# Patient Record
Sex: Female | Born: 1985 | Race: Black or African American | Hispanic: No | Marital: Single | Smoking: Light tobacco smoker
Health system: Southern US, Community
[De-identification: ages and names within clinical notes are randomized; demographics above are authoritative.]

---
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
## 2016-05-03 ENCOUNTER — Emergency Department (HOSPITAL_COMMUNITY)
Admission: EM | Admit: 2016-05-03 | Discharge: 2016-05-03 | Disposition: A | Discharge status: Home / Self Care | Payer: Self-pay | Attending: Emergency Medicine | Admitting: Emergency Medicine

## 2016-05-03 ENCOUNTER — Encounter (HOSPITAL_COMMUNITY): Payer: Self-pay

## 2016-05-03 DIAGNOSIS — M79605 Pain in left leg: Secondary | ICD-10-CM | POA: Insufficient documentation

## 2016-05-03 MED ORDER — METHOCARBAMOL 500 MG PO TABS: 500.0000 mg | ORAL_TABLET | Freq: Two times a day (BID) | ORAL | 0 refills | Status: DC

## 2016-05-03 MED ORDER — NAPROXEN 500 MG PO TABS: 500.0000 mg | ORAL_TABLET | Freq: Two times a day (BID) | ORAL | 0 refills | Status: DC

## 2016-05-03 NOTE — ED Provider Notes (Signed)
[facility]-EMERGENCY DEPT Provider Note   CSN: [id number] Arrival date & time: 05/03/16  1029     History   Chief Complaint Chief Complaint  Patient presents with  . Numbness  . Leg Pain    HPI Donna West is a 31 y.o. female.  HPI   Donna West is a 31 y.o. female, patient with no pertinent past medical history, presenting to the ED with left lower leg pain beginning last night. Pain starts at the left knee and, with standing, radiates down towards the heel. Pain improves when not weightbearing. Patient also endorses some associated tingling in the heel with weightbearing. Patient states that she recently began a new job that involves standing for at least 8 hours at a time. Patient has not taken any medications for her symptoms. Denies weakness, known trauma, fever, or any other complaints.   History reviewed. No pertinent past medical history.  There are no active problems to display for this patient.   History reviewed. No pertinent surgical history.  OB History    No data available       Home Medications    Prior to Admission medications   Medication Sig Start Date End Date Taking? Authorizing Provider  methocarbamol (ROBAXIN) 500 MG tablet Take 1 tablet (500 mg total) by mouth 2 (two) times daily. 05/03/16   Shawn C Joy, PA-C  naproxen (NAPROSYN) 500 MG tablet Take 1 tablet (500 mg total) by mouth 2 (two) times daily. 05/03/16   Anselm Pancoast, PA-C    Family History History reviewed. No pertinent family history.  Social History Social History  Substance Use Topics  . Smoking status: Never Smoker  . Smokeless tobacco: Never Used  . Alcohol use Yes     Comment: occasionally     Allergies   Patient has no known allergies.   Review of Systems Review of Systems  Musculoskeletal: Positive for arthralgias and myalgias. Negative for back pain and joint swelling.  Neurological: Negative for weakness and numbness.     Physical Exam Updated Vital Signs BP  136/85   Pulse (!) 48   Temp 98.5 F (36.9 C) (Oral)   Resp 20   Ht 5\' 3"  (1.6 m)   Wt 54.4 kg   LMP 04/27/2016   SpO2 97%   BMI 21.26 kg/m   Physical Exam  Constitutional: She appears well-developed and well-nourished. No distress.  HENT:  Head: Normocephalic and atraumatic.  Eyes: Conjunctivae are normal.  Neck: Neck supple.  Cardiovascular: Normal rate, regular rhythm and intact distal pulses.   Pulmonary/Chest: Effort normal.  Musculoskeletal: Normal range of motion. She exhibits tenderness. She exhibits no edema or deformity.  Mild tenderness to palpation posterior left knee. Full range of motion in the left knee. No swelling, deformity, crepitus, effusion, or laxity noted. Patient is weightbearing without difficulty or assistance.  Neurological: She is alert.  No sensory deficits. Strength 5 out of 5 with flexion and extension of the lower extremities. No gait deficit. Coordination intact.  Skin: Skin is warm and dry. Capillary refill takes less than 2 seconds. She is not diaphoretic.  Psychiatric: She has a normal mood and affect. Her behavior is normal.  Nursing note and vitals reviewed.    ED Treatments / Results  Labs (all labs ordered are listed, but only abnormal results are displayed) Labs Reviewed - No data to display  EKG  EKG Interpretation None       Radiology No results found.  Procedures Procedures (including critical care  time)  Medications Ordered in ED Medications - No data to display   Initial Impression / Assessment and Plan / ED Course  I have reviewed the triage vital signs and the nursing notes.  Pertinent labs & imaging results that were available during my care of the patient were reviewed by me and considered in my medical decision making (see chart for details).  Clinical Course     Patient presents with left leg pain beginning yesterday. Suspect the pain is connected with inflammation from overuse, secondary to her new job.  Home care and return precautions discussed.    Final Clinical Impressions(s) / ED Diagnoses   Final diagnoses:  Left leg pain    New Prescriptions Discharge Medication List as of 05/03/2016  7:22 PM    START taking these medications   Details  methocarbamol (ROBAXIN) 500 MG tablet Take 1 tablet (500 mg total) by mouth 2 (two) times daily., Starting Thu 05/03/2016, Print    naproxen (NAPROSYN) 500 MG tablet Take 1 tablet (500 mg total) by mouth 2 (two) times daily., Starting Thu 05/03/2016, Print         Anselm PancoastShawn C Joy, PA-C 05/04/16 0055    Anselm PancoastShawn C Joy, PA-C 05/04/16 0056    Lyndal Pulleyaniel Knott, MD 05/04/16 [phone]

## 2016-05-03 NOTE — Discharge Instructions (Signed)
Expect your soreness to increase over the next 2-3 days. Take it easy, but do not lay around too much as this may make any stiffness worse. Take 500 mg of naproxen every 12 hours or 800 mg of ibuprofen every 8 hours for the next 3 days. Take these medications with food to avoid upset stomach. Robaxin is a muscle relaxer and may help loosen stiff muscles. Do not take the Robaxin while driving or performing other dangerous activities. Be sure to perform the attached exercises starting with three times a week and working up to performing them daily. This is an essential part of preventing long term problems. Follow up with a primary care provider for any future management of these complaints. °

## 2016-05-03 NOTE — ED Triage Notes (Signed)
Patient states she has tingling of the left upper leg when resting and when she stands she has pain and numbness to the left foot. Pain and numbness started yesterday

## 2017-06-12 ENCOUNTER — Emergency Department (HOSPITAL_COMMUNITY)
Admission: EM | Admit: 2017-06-12 | Discharge: 2017-06-12 | Disposition: A | Discharge status: Home / Self Care | Payer: Self-pay | Attending: Emergency Medicine | Admitting: Emergency Medicine

## 2017-06-12 ENCOUNTER — Other Ambulatory Visit: Payer: Self-pay

## 2017-06-12 ENCOUNTER — Encounter (HOSPITAL_COMMUNITY): Payer: Self-pay | Admitting: Obstetrics and Gynecology

## 2017-06-12 ENCOUNTER — Emergency Department (HOSPITAL_COMMUNITY): Payer: Self-pay

## 2017-06-12 DIAGNOSIS — Y929 Unspecified place or not applicable: Secondary | ICD-10-CM | POA: Insufficient documentation

## 2017-06-12 DIAGNOSIS — S0280XA Fracture of other specified skull and facial bones, unspecified side, initial encounter for closed fracture: Secondary | ICD-10-CM | POA: Insufficient documentation

## 2017-06-12 DIAGNOSIS — Y999 Unspecified external cause status: Secondary | ICD-10-CM | POA: Insufficient documentation

## 2017-06-12 DIAGNOSIS — S0285XA Fracture of orbit, unspecified, initial encounter for closed fracture: Secondary | ICD-10-CM

## 2017-06-12 DIAGNOSIS — M545 Low back pain, unspecified: Secondary | ICD-10-CM

## 2017-06-12 DIAGNOSIS — S6991XA Unspecified injury of right wrist, hand and finger(s), initial encounter: Secondary | ICD-10-CM | POA: Insufficient documentation

## 2017-06-12 DIAGNOSIS — S022XXA Fracture of nasal bones, initial encounter for closed fracture: Secondary | ICD-10-CM | POA: Insufficient documentation

## 2017-06-12 DIAGNOSIS — Y939 Activity, unspecified: Secondary | ICD-10-CM | POA: Insufficient documentation

## 2017-06-12 LAB — POC URINE PREG, ED: Preg Test, Ur: NEGATIVE

## 2017-06-12 MED ORDER — OXYCODONE-ACETAMINOPHEN 5-325 MG PO TABS
1.0000 | ORAL_TABLET | Freq: Once | ORAL | Status: AC
  Administered 2017-06-12: 1 via ORAL
  Filled 2017-06-12: qty 1

## 2017-06-12 MED ORDER — HYDROCODONE-ACETAMINOPHEN 5-325 MG PO TABS: 1.0000 | ORAL_TABLET | Freq: Four times a day (QID) | ORAL | 0 refills | Status: DC | PRN
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
## 2017-06-12 NOTE — Discharge Instructions (Signed)
Please read instructions below. You can take 600 mg of Advil/ibuprofen every 6 hours as needed for pain. You can take hydrocodone every 6 hours as needed for moderate-severe pain. Do not take tylenol, drink alcohol, or drive while taking this medication. You can take advil/ibuprofen with this medication. Apply ice to your face and wrist for 20 minutes at a time. Do NOT blow your nose. Schedule an appointment with the ENT specialist, Dr. Jearld FentonByers, on Monday for re-evaluation. Return to the ER for severely worsening headache, vision changes, if you are unable to move your eyes in all directions, bowel/bladder incontinence, vomiting, weakness or numbness in your extremities, or new or concerning symptoms.
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
## 2017-06-12 NOTE — ED Notes (Signed)
I have called Social work. Social work will be in route to room to assist pt in finding resources such as housing, help and living accomodations for current situation.
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
## 2019-03-04 ENCOUNTER — Emergency Department (HOSPITAL_COMMUNITY)
Admission: EM | Admit: 2019-03-04 | Discharge: 2019-03-05 | Disposition: A | Discharge status: Home / Self Care | Payer: Self-pay | Attending: Emergency Medicine | Admitting: Emergency Medicine

## 2019-03-04 ENCOUNTER — Encounter (HOSPITAL_COMMUNITY): Payer: Self-pay

## 2019-03-04 ENCOUNTER — Other Ambulatory Visit: Payer: Self-pay

## 2019-03-04 DIAGNOSIS — R112 Nausea with vomiting, unspecified: Secondary | ICD-10-CM | POA: Insufficient documentation

## 2019-03-04 DIAGNOSIS — R197 Diarrhea, unspecified: Secondary | ICD-10-CM | POA: Insufficient documentation

## 2019-03-04 DIAGNOSIS — F17210 Nicotine dependence, cigarettes, uncomplicated: Secondary | ICD-10-CM | POA: Insufficient documentation

## 2019-03-04 LAB — LIPASE, BLOOD: Lipase: 33 U/L (ref 11–51)

## 2019-03-04 LAB — CBC
HCT: 39.6 % (ref 36.0–46.0)
Hemoglobin: 12.6 g/dL (ref 12.0–15.0)
MCH: 31.5 pg (ref 26.0–34.0)
MCHC: 31.8 g/dL (ref 30.0–36.0)
MCV: 99 fL (ref 80.0–100.0)
Platelets: 141 K/uL — ABNORMAL LOW (ref 150–400)
RBC: 4 MIL/uL (ref 3.87–5.11)
RDW: 11.4 % — ABNORMAL LOW (ref 11.5–15.5)
WBC: 9.5 K/uL (ref 4.0–10.5)
nRBC: 0 % (ref 0.0–0.2)

## 2019-03-04 LAB — URINALYSIS, ROUTINE W REFLEX MICROSCOPIC
Bilirubin Urine: NEGATIVE
Glucose, UA: NEGATIVE mg/dL
Hgb urine dipstick: NEGATIVE
Ketones, ur: 5 mg/dL — AB
Leukocytes,Ua: NEGATIVE
Nitrite: NEGATIVE
Protein, ur: NEGATIVE mg/dL
Specific Gravity, Urine: 1.019 (ref 1.005–1.030)
pH: 7 (ref 5.0–8.0)

## 2019-03-04 LAB — COMPREHENSIVE METABOLIC PANEL
ALT: 17 U/L (ref 0–44)
AST: 28 U/L (ref 15–41)
Albumin: 4.5 g/dL (ref 3.5–5.0)
Alkaline Phosphatase: 44 U/L (ref 38–126)
Anion gap: 11 (ref 5–15)
BUN: 13 mg/dL (ref 6–20)
CO2: 24 mmol/L (ref 22–32)
Calcium: 9.3 mg/dL (ref 8.9–10.3)
Chloride: 105 mmol/L (ref 98–111)
Creatinine, Ser: 0.74 mg/dL (ref 0.44–1.00)
GFR calc Af Amer: >60 mL/min (ref >60)
GFR calc non Af Amer: >60 mL/min (ref >60)
Glucose, Bld: 96 mg/dL (ref 70–99)
Potassium: 3.8 mmol/L (ref 3.5–5.1)
Sodium: 140 mmol/L (ref 135–145)
Total Bilirubin: 0.6 mg/dL (ref 0.3–1.2)
Total Protein: 7.6 g/dL (ref 6.5–8.1)

## 2019-03-04 LAB — I-STAT BETA HCG BLOOD, ED (MC, WL, AP ONLY): I-stat hCG, quantitative: <5 m[IU]/mL (ref <5)

## 2019-03-04 MED ORDER — ONDANSETRON 4 MG PO TBDP
4.0000 mg | ORAL_TABLET | Freq: Once | ORAL | Status: AC | PRN
  Administered 2019-03-04: 20:00:00 4 mg via ORAL
  Filled 2019-03-04: qty 1

## 2019-03-04 MED ORDER — SODIUM CHLORIDE 0.9% FLUSH: 3.0000 mL | Freq: Once | INTRAVENOUS | Status: DC

## 2019-03-04 NOTE — ED Triage Notes (Signed)
Pt reports abd pain and emesis after eating salmon today around 230pm. Pt vomiting in triage.

## 2019-03-05 MED ORDER — LACTATED RINGERS IV BOLUS
1000.0000 mL | Freq: Once | INTRAVENOUS | Status: AC
  Administered 2019-03-05: 02:00:00 1000 mL via INTRAVENOUS

## 2019-03-05 MED ORDER — METOCLOPRAMIDE HCL 10 MG PO TABS: 10.0000 mg | ORAL_TABLET | Freq: Four times a day (QID) | ORAL | 0 refills | Status: AC | PRN

## 2019-03-05 MED ORDER — METOCLOPRAMIDE HCL 5 MG/ML IJ SOLN
10.0000 mg | Freq: Once | INTRAMUSCULAR | Status: AC
  Administered 2019-03-05: 02:00:00 10 mg via INTRAVENOUS
  Filled 2019-03-05: qty 2

## 2019-03-05 NOTE — ED Notes (Signed)
Meal and Beverage Given

## 2019-03-05 NOTE — ED Notes (Signed)
Patient verbalizes understanding of discharge instructions. Opportunity for questioning and answers were provided. Armband removed by staff, pt discharged from ED. Pt. ambulatory and discharged home.  

## 2019-03-05 NOTE — ED Provider Notes (Signed)
Emergency Department Provider Note   I have reviewed the triage vital signs and the nursing notes.   HISTORY  Chief Complaint Emesis and Abdominal Pain   HPI Donna West is a 33 y.o. female without significant past medical history who presents to the emergency department today with vomiting.  Patient states she was at [employer] and she has some salmon.  She says shortly after this bout 20 to 30 minutes she started to have an nonbloody nonbilious emesis.  States the food was not even digested.  She multiple episodes of vomiting since then with the stomach acid.  She states she got a tablet here which seemed to help some but she still has some crampy abdominal pain.  No urinary symptoms.  Had 2 or 3 episodes of diarrhea throughout the day today as well.  No fevers.  No specific abdominal pain aside from prior to throwing up.   No other associated or modifying symptoms.    History reviewed. No pertinent past medical history.  There are no active problems to display for this patient.   History reviewed. No pertinent surgical history.  Current Outpatient Rx  . Order #: [id number] Class: Print    Allergies Patient has no known allergies.  No family history on file.  Social History Social History   Tobacco Use  . Smoking status: Light Tobacco Smoker  . Smokeless tobacco: Never Used  . Tobacco comment: Social smoker, 1 per week  Substance Use Topics  . Alcohol use: Yes    Comment: occasionally  . Drug use: No    Review of Systems  All other systems negative except as documented in the HPI. All pertinent positives and negatives as reviewed in the HPI. ____________________________________________   PHYSICAL EXAM:  VITAL SIGNS: ED Triage Vitals  Enc Vitals Group     BP 03/04/19 1839 (!) 131/92     Pulse Rate 03/04/19 1839 (!) 101     Resp 03/04/19 1839 16     Temp 03/04/19 1839 98 F (36.7 C)     Temp Source 03/04/19 1839 Oral     SpO2 03/04/19 1839 98 %     Constitutional: Alert and oriented. Well appearing and in no acute distress. Eyes: Conjunctivae are normal. PERRL. EOMI. Head: Atraumatic. Nose: No congestion/rhinnorhea. Mouth/Throat: Mucous membranes are moist.  Oropharynx non-erythematous. Neck: No stridor.  No meningeal signs.   Cardiovascular: Normal rate, regular rhythm. Good peripheral circulation. Grossly normal heart sounds.   Respiratory: Normal respiratory effort.  No retractions. Lungs CTAB. Gastrointestinal: Soft and nontender. No distention.  Musculoskeletal: No lower extremity tenderness nor edema. No gross deformities of extremities. Neurologic:  Normal speech and language. No gross focal neurologic deficits are appreciated.  Skin:  Skin is warm, dry and intact. No rash noted.  ____________________________________________   LABS (all labs ordered are listed, but only abnormal results are displayed)  Labs Reviewed  CBC - Abnormal; Notable for the following components:      Result Value   RDW 11.4 (*)    Platelets 141 (*)    All other components within normal limits  URINALYSIS, ROUTINE W REFLEX MICROSCOPIC - Abnormal; Notable for the following components:   Ketones, ur 5 (*)    All other components within normal limits  LIPASE, BLOOD  COMPREHENSIVE METABOLIC PANEL  I-STAT BETA HCG BLOOD, ED ([facility], [facility], [facility] ONLY)   ____________________________________________   INITIAL IMPRESSION / ASSESSMENT AND PLAN / ED COURSE  Abdomen benign.  Mouth dry.  Vital signs within  normal lungs.  No indication for imaging as I think it is unlikely to be specific abdominal pathology such as appendicitis, bowel obstruction, colitis, cholecystitis at this time secondary to a nonfocal exam.  White count is normal.  Low suspicion for pancreatitis without abnormal lipase or focal findings.  Will provide supportive care and reevaluate for likely discharge.  Pertinent labs & imaging results that were available during my care of the patient were  reviewed by me and considered in my medical decision making (see chart for details).  A medical screening exam was performed and I feel the patient has had an appropriate workup for their chief complaint at this time and likelihood of emergent condition existing is low. They have been counseled on decision, discharge, follow up and which symptoms necessitate immediate return to the emergency department. They or their family verbally stated understanding and agreement with plan and discharged in stable condition.   ____________________________________________  FINAL CLINICAL IMPRESSION(S) / ED DIAGNOSES  Final diagnoses:  Non-intractable vomiting with nausea, unspecified vomiting type     MEDICATIONS GIVEN DURING THIS VISIT:  Medications  ondansetron (ZOFRAN-ODT) disintegrating tablet 4 mg (4 mg Oral Given 03/04/19 2003)  lactated ringers bolus 1,000 mL (0 mLs Intravenous Stopped 03/05/19 0316)  metoCLOPramide (REGLAN) injection 10 mg (10 mg Intravenous Given 03/05/19 0212)     NEW OUTPATIENT MEDICATIONS STARTED DURING THIS VISIT:  Discharge Medication List as of 03/05/2019  6:18 AM    START taking these medications   Details  metoCLOPramide (REGLAN) 10 MG tablet Take 1 tablet (10 mg total) by mouth every 6 (six) hours as needed for nausea (nausea/headache)., Starting Thu 03/05/2019, Print        Note:  This note was prepared with assistance of Dragon voice recognition software. Occasional wrong-word or sound-a-like substitutions may have occurred due to the inherent limitations of voice recognition software.   Merrily Pew, MD 03/06/19 [phone]

## 2019-06-01 ENCOUNTER — Other Ambulatory Visit: Payer: Self-pay

## 2019-06-01 ENCOUNTER — Encounter (HOSPITAL_COMMUNITY): Payer: Self-pay | Admitting: Emergency Medicine

## 2019-06-01 ENCOUNTER — Emergency Department (HOSPITAL_COMMUNITY)
Admission: EM | Admit: 2019-06-01 | Discharge: 2019-06-01 | Disposition: A | Discharge status: Home / Self Care | Payer: Self-pay | Attending: Emergency Medicine | Admitting: Emergency Medicine

## 2019-06-01 DIAGNOSIS — R21 Rash and other nonspecific skin eruption: Secondary | ICD-10-CM

## 2019-06-01 DIAGNOSIS — L299 Pruritus, unspecified: Secondary | ICD-10-CM | POA: Insufficient documentation

## 2019-06-01 DIAGNOSIS — Z720 Tobacco use: Secondary | ICD-10-CM | POA: Insufficient documentation

## 2019-06-01 MED ORDER — HYDROXYZINE HCL 25 MG PO TABS: 25.0000 mg | ORAL_TABLET | Freq: Four times a day (QID) | ORAL | 0 refills | Status: AC

## 2019-06-01 MED ORDER — PREDNISONE 20 MG PO TABS
60.0000 mg | ORAL_TABLET | Freq: Once | ORAL | Status: AC
  Administered 2019-06-01: 17:00:00 60 mg via ORAL
  Filled 2019-06-01: qty 3

## 2019-06-01 MED ORDER — PREDNISONE 20 MG PO TABS: 60.0000 mg | ORAL_TABLET | Freq: Every day | ORAL | 0 refills | Status: AC

## 2019-06-01 MED ORDER — HYDROXYZINE HCL 25 MG PO TABS
25.0000 mg | ORAL_TABLET | Freq: Once | ORAL | Status: AC
Start: 2019-06-01 — End: 2019-06-01
  Administered 2019-06-01: 17:00:00 25 mg via ORAL
  Filled 2019-06-01: qty 1

## 2019-06-01 NOTE — ED Triage Notes (Signed)
Pt noticed a rash all over her abd, back, face and arms for a week.

## 2019-06-01 NOTE — ED Provider Notes (Signed)
[facility] EMERGENCY DEPARTMENT Provider Note   CSN: [id number] Arrival date & time: 06/01/19  1344     History Chief Complaint  Patient presents with  . Rash    Donna West is a 34 y.o. female.  Donna West is a 33 y.o. female who is otherwise healthy, presents to the ED for evaluation of a diffuse rash present over her chest, abdomen, back, arms and face.  Rash started 1 week ago as a spot on her right arm and then has spread more diffusely.  She reports the rash is extremely itchy and sometimes feels like it is burning but is not painful.  She denies any open vesicles or pustules, no drainage from the rash.  She denies any associated facial swelling, difficulty swallowing or breathing, chest pain, shortness of breath, nausea, vomiting or fevers.  Denies any history of similar rash previously.  No changes to medications, soaps, make-up, household products or diet.  She has been taking Benadryl which allows her to go to sleep at least, helps minimally with itching.        History reviewed. No pertinent past medical history.  There are no problems to display for this patient.   History reviewed. No pertinent surgical history.   OB History    Gravida      Para      Term      Preterm      AB      Living  0     SAB      TAB      Ectopic      Multiple      Live Births              No family history on file.  Social History   Tobacco Use  . Smoking status: Light Tobacco Smoker  . Smokeless tobacco: Never Used  . Tobacco comment: Social smoker, 1 per week  Substance Use Topics  . Alcohol use: Yes    Comment: occasionally  . Drug use: No    Home Medications Prior to Admission medications   Medication Sig Start Date End Date Taking? Authorizing Provider  metoCLOPramide (REGLAN) 10 MG tablet Take 1 tablet (10 mg total) by mouth every 6 (six) hours as needed for nausea (nausea/headache). 03/05/19   Mesner, Barbara Cower, MD     Allergies    Patient has no known allergies.  Review of Systems   Review of Systems  Constitutional: Negative for chills and fever.  Respiratory: Negative for cough and shortness of breath.   Cardiovascular: Negative for chest pain.  Gastrointestinal: Negative for nausea and vomiting.  Skin: Positive for rash.  All other systems reviewed and are negative.   Physical Exam Updated Vital Signs BP (!) 141/100 (BP Location: Left Arm)   Pulse 99   Temp 98.5 F (36.9 C) (Oral)   Resp 16   Ht 5\' 3"  (1.6 m)   Wt 54.4 kg   SpO2 97%   BMI 21.26 kg/m   Physical Exam Vitals and nursing note reviewed.  Constitutional:      General: She is not in acute distress.    Appearance: Normal appearance. She is well-developed and normal weight. She is not diaphoretic.  HENT:     Head: Normocephalic and atraumatic.     Mouth/Throat:     Mouth: Mucous membranes are moist.     Pharynx: Oropharynx is clear.     Comments: No mucosal involement Eyes:  General:        Right eye: No discharge.        Left eye: No discharge.  Pulmonary:     Effort: Pulmonary effort is normal. No respiratory distress.  Skin:    General: Skin is warm and dry.     Findings: Rash present.     Comments: Diffuse papular rash noted over the face, chest, abdomen, back and upper extremities, largest lesion noted on the right arm. no vesicles, pustules or petechiae, no skin sloughing  Neurological:     Mental Status: She is alert and oriented to person, place, and time.     Coordination: Coordination normal.  Psychiatric:        Mood and Affect: Mood normal.        Behavior: Behavior normal.      ED Results / Procedures / Treatments   Labs (all labs ordered are listed, but only abnormal results are displayed) Labs Reviewed - No data to display  EKG None  Radiology No results found.  Procedures Procedures (including critical care time)  Medications Ordered in ED Medications  hydrOXYzine  (ATARAX/VISTARIL) tablet 25 mg (25 mg Oral Given 06/01/19 1642)  predniSONE (DELTASONE) tablet 60 mg (60 mg Oral Given 06/01/19 1642)    ED Course  I have reviewed the triage vital signs and the nursing notes.  Pertinent labs & imaging results that were available during my care of the patient were reviewed by me and considered in my medical decision making (see chart for details).    MDM Rules/Calculators/A&P                     Rash concerning for pityriasis versus dermatitis.  The rash does have what appears to be a herald patch on the right arm and does have Christmas Tree distribution, but it is uncommon to see pityriasis involve the face, so this could also be dermatitis. Patient denies any difficulty breathing or swallowing.  Pt has a patent airway without stridor and is handling secretions without difficulty; no angioedema. No blisters, no pustules, no warmth, no draining sinus tracts, no superficial abscesses, no bullous impetigo, no vesicles, no desquamation, no target lesions with dusky purpura or a central bulla. Not tender to touch. No concern for superimposed infection. No concern for SJS, TEN, TSS, tick borne illness, syphilis or other life-threatening condition. Will discharge home with short course of steroids, hydroxyzine prescribed for itching.    Final Clinical Impression(s) / ED Diagnoses Final diagnoses:  Rash    Rx / DC Orders ED Discharge Orders         Ordered    predniSONE (DELTASONE) 20 MG tablet  Daily     06/01/19 1702    hydrOXYzine (ATARAX/VISTARIL) 25 MG tablet  Every 6 hours     06/01/19 1702           Janet Berlin 06/02/19 2250    Davonna Belling, MD 06/03/19 [id number]

## 2019-06-01 NOTE — Discharge Instructions (Signed)
Take steroids for the next 5 days beginning tomorrow, use hydroxyzine as needed for itching.  You can also use cool compresses.  Follow-up with your primary care doctor or dermatologist for further evaluation of this rash if it does not improve.

## 2019-10-17 ENCOUNTER — Other Ambulatory Visit: Payer: Self-pay

## 2019-10-17 ENCOUNTER — Emergency Department (HOSPITAL_COMMUNITY)
Admission: EM | Admit: 2019-10-17 | Discharge: 2019-10-17 | Disposition: A | Discharge status: Home / Self Care | Payer: Self-pay | Attending: Emergency Medicine | Admitting: Emergency Medicine

## 2019-10-17 ENCOUNTER — Encounter (HOSPITAL_COMMUNITY): Payer: Self-pay

## 2019-10-17 DIAGNOSIS — F419 Anxiety disorder, unspecified: Secondary | ICD-10-CM | POA: Insufficient documentation

## 2019-10-17 DIAGNOSIS — F1910 Other psychoactive substance abuse, uncomplicated: Secondary | ICD-10-CM | POA: Insufficient documentation

## 2019-10-17 DIAGNOSIS — F17200 Nicotine dependence, unspecified, uncomplicated: Secondary | ICD-10-CM | POA: Insufficient documentation

## 2019-10-17 DIAGNOSIS — F1990 Other psychoactive substance use, unspecified, uncomplicated: Secondary | ICD-10-CM

## 2019-10-17 DIAGNOSIS — R6883 Chills (without fever): Secondary | ICD-10-CM | POA: Insufficient documentation

## 2019-10-17 LAB — RAPID URINE DRUG SCREEN, HOSP PERFORMED
Amphetamines: NOT DETECTED
Barbiturates: NOT DETECTED
Benzodiazepines: NOT DETECTED
Cocaine: NOT DETECTED
Opiates: NOT DETECTED
Tetrahydrocannabinol: NOT DETECTED

## 2019-10-17 LAB — ACETAMINOPHEN LEVEL: Acetaminophen (Tylenol), Serum: <10 ug/mL — ABNORMAL LOW (ref 10–30)

## 2019-10-17 LAB — SALICYLATE LEVEL: Salicylate Lvl: <7 mg/dL — ABNORMAL LOW (ref 7.0–30.0)

## 2019-10-17 LAB — ETHANOL: Alcohol, Ethyl (B): <10 mg/dL (ref <10)

## 2019-10-17 LAB — PREGNANCY, URINE: Preg Test, Ur: NEGATIVE

## 2019-10-17 MED ORDER — SODIUM CHLORIDE 0.9 % IV BOLUS
500.0000 mL | Freq: Once | INTRAVENOUS | Status: AC
  Administered 2019-10-17: 500 mL via INTRAVENOUS

## 2019-10-17 MED ORDER — ACETAMINOPHEN 325 MG PO TABS
650.0000 mg | ORAL_TABLET | Freq: Once | ORAL | Status: AC
  Administered 2019-10-17: 650 mg via ORAL
  Filled 2019-10-17: qty 2

## 2019-10-17 NOTE — ED Notes (Signed)
Pt states her chest is beating really hard. Pulled into triage for reassessment. VS obtained. ECG performed. Provider updated.

## 2019-10-17 NOTE — Discharge Instructions (Addendum)
You were seen for substance use disorder.  Your labs look reassuring, recommend that you do not take drugs.   I provided you with resources for outpatient counseling for substance use disorder.  I have also given you information for community health and wellness as a work with individuals who have little to no insurance they can help you find a primary care doctor please schedule at your earliest convenience.  I want you to come back to emergency department if you develop severe chest pain, shortness of breath, nausea, vomiting, diarrhea, abdominal pain, fever as he symptoms require further evaluation.

## 2019-10-17 NOTE — ED Notes (Signed)
Pt demanding to speak with charge nurse. States they deserve faster treatment.

## 2019-10-17 NOTE — ED Notes (Signed)
Spoke with patient's significant other. Explained that we have done an EKG and shown it to the doctor. He verbalized understanding that we are working as fast as we can.
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
## 2019-10-17 NOTE — ED Triage Notes (Addendum)
Pt states she took an ectasy 3 hours ago and states rapid breathing.

## 2019-10-17 NOTE — ED Provider Notes (Signed)
[facility] DEPT Provider Note   CSN: [id number] Arrival date & time: 10/17/19  3474     History Chief Complaint  Patient presents with   Drug Ingestion.    Donna West is a 34 y.o. female.  HPI   Patient presents to the emergency department with complaint of ecstasy overdose.  Patient states she took ecstasy last night around 11 PM and feels like her heart was racing and she felt like she was going to pass out as well as feeling anxious and very cold.  She denies hitting her head, falling or losing consciousness.  Patient denies any suicidal or homicidal ideations.  She denies the use  IV drug use, does not smoke cigarettes or marijuana, denies alcohol use.  Patient has no significant medical history, does not take any medication on daily basis.  Patient denies headache, fever, sore throat, cough, chest pain, shortness of breath, abdominal pain, nausea, vomiting, urinary symptoms, diarrhea, constipation.  History reviewed. No pertinent past medical history.  There are no problems to display for this patient.   History reviewed. No pertinent surgical history.   OB History    Gravida      Para      Term      Preterm      AB      Living  0     SAB      TAB      Ectopic      Multiple      Live Births              No family history on file.  Social History   Tobacco Use   Smoking status: Light Tobacco Smoker   Smokeless tobacco: Never Used   Tobacco comment: Social smoker, 1 per week  Substance Use Topics   Alcohol use: Yes    Comment: occasionally   Drug use: No    Home Medications Prior to Admission medications   Medication Sig Start Date End Date Taking? Authorizing Provider  acetaminophen (TYLENOL) 325 MG tablet Take 650 mg by mouth every 6 (six) hours as needed (toothache).   Yes [provider]  Multiple Vitamins-Minerals (CENTRUM SILVER PO) Take 1 tablet by mouth daily.   Yes [provider]  OVER THE COUNTER MEDICATION Take by mouth daily. Seamoss   Yes [provider]  OVER THE COUNTER MEDICATION Take by mouth daily. Flaxseed oil   Yes [provider]  hydrOXYzine (ATARAX/VISTARIL) 25 MG tablet Take 1 tablet (25 mg total) by mouth every 6 (six) hours. Patient not taking: Reported on 10/17/2019 06/01/19   Jacqlyn Larsen, PA-C  metoCLOPramide (REGLAN) 10 MG tablet Take 1 tablet (10 mg total) by mouth every 6 (six) hours as needed for nausea (nausea/headache). Patient not taking: Reported on 10/17/2019 03/05/19   Mesner, Corene Cornea, MD    Allergies    Patient has no known allergies.  Review of Systems   Review of Systems  Constitutional: Positive for chills. Negative for fever.  HENT: Negative for congestion, sore throat and tinnitus.   Eyes: Negative for redness.  Respiratory: Negative for cough and shortness of breath.   Cardiovascular: Negative for chest pain and leg swelling.  Gastrointestinal: Negative for abdominal pain, diarrhea, nausea and vomiting.  Genitourinary: Negative for dysuria, enuresis and vaginal discharge.  Musculoskeletal: Negative for back pain and joint swelling.  Skin: Negative for rash.  Neurological: Negative for dizziness and headaches.  Hematological: Does not bruise/bleed easily.  Psychiatric/Behavioral: Negative for self-injury and suicidal ideas. The patient is nervous/anxious.     Physical Exam Updated Vital Signs BP 108/62    Pulse 60    Temp 98.4 F (36.9 C) (Oral)    Resp 16    Ht 5\' 3"  (1.6 m)    Wt 49.9 kg    SpO2 100%    BMI 19.49 kg/m   Physical Exam Vitals and nursing note reviewed.  Constitutional:      General: She is not in acute distress.    Appearance: She is not ill-appearing.  HENT:     Head: Normocephalic and atraumatic.     Nose: No congestion.     Mouth/Throat:     Mouth: Mucous membranes are moist.     Pharynx: Oropharynx is clear.  Eyes:     General: No scleral icterus.       Right  eye: No discharge.        Left eye: No discharge.     Extraocular Movements: Extraocular movements intact.     Conjunctiva/sclera: Conjunctivae normal.     Pupils: Pupils are equal, round, and reactive to light.  Cardiovascular:     Rate and Rhythm: Normal rate and regular rhythm.     Pulses: Normal pulses.     Heart sounds: No murmur heard.  No friction rub. No gallop.   Pulmonary:     Effort: No respiratory distress.     Breath sounds: No wheezing, rhonchi or rales.  Abdominal:     General: There is no distension.     Palpations: Abdomen is soft.     Tenderness: There is no abdominal tenderness. There is no guarding.  Musculoskeletal:        General: No swelling, tenderness, deformity or signs of injury.     Right lower leg: No edema.     Left lower leg: No edema.  Skin:    General: Skin is warm and dry.     Capillary Refill: Capillary refill takes less than 2 seconds.     Findings: No rash.     Comments: Skin exam was performed, there were no signs of track marks, no rashes, erythema, lacerations or abrasions no gross abnormalities noted.  Neurological:     Mental Status: She is alert and oriented to person, place, and time.  Psychiatric:        Mood and Affect: Mood normal.     Comments: Patient appears to be anxious.     ED Results / Procedures / Treatments   Labs (all labs ordered are listed, but only abnormal results are displayed) Labs Reviewed  SALICYLATE LEVEL - Abnormal; Notable for the following components:      Result Value   Salicylate Lvl <7.0 (*)    All other components within normal limits  ACETAMINOPHEN LEVEL - Abnormal; Notable for the following components:   Acetaminophen (Tylenol), Serum <10 (*)    All other components within normal limits  RAPID URINE DRUG SCREEN, HOSP PERFORMED  ETHANOL  PREGNANCY, URINE    EKG None  Radiology No results found.  Procedures Procedures (including critical care time)  Medications Ordered in  ED Medications  sodium chloride 0.9 % bolus 500 mL (0 mLs Intravenous Stopped 10/17/19 1116)  acetaminophen (TYLENOL) tablet 650 mg (650 mg Oral Given 10/17/19 1131)    ED Course  I have reviewed the triage vital signs and the nursing notes.  Pertinent labs & imaging results that were available during my care of the  patient were reviewed by me and considered in my medical decision making (see chart for details).    MDM Rules/Calculators/A&P                          I have personally reviewed all imaging, labs and have interpreted them.  Due to patient's presentation most concern for polysubstance abuse, arrhythmias, systemic infection.  Unlikely patient suffering from a systemic infection as vital signs are reassuring, nontachycardic, afebrile, nontachypneic, nontoxic appearing, patient does not endorse fever, cough, nasal congestion.  Unlikely patient is suffering from arrhythmias as EKG show no signs of ischemia, patient does not endorse chest pain, shortness of breath, physical exam did not show signs of hypoperfusion, no pedal edema noted.  Rapid drug screen did not detect  opiates, cocaine, benzos, amphetamines, cannabis, barbiturates.  Acetaminophen less than 10, a Sisley less than 7, ethanol less than 10.  Patient was given fluids and Tylenol and has responded well to this treatment.  Patient states she is feeling much better.  Patient appears to be resting comfortably in bed, no obvious signs distress, tolerating p.o. without difficulty.  Vital signs have remained stable does not meet criteria to be admitted to the hospital.  Likely patient suffers from substance abuse disorder recommend that she follows up outpatient with drug counseling.  Patient was discussed with attending who agrees with assessment and plan.  Patient was given at home instructions as well as strict return precautions.  Patient verbalized that she understood and agreed the plan. Final Clinical Impression(s) / ED  Diagnoses Final diagnoses:  Substance use disorder    Rx / DC Orders ED Discharge Orders    None       Carroll Sage, PA-C 10/17/19 1418    Mancel Bale, MD 10/17/19 2030

## 2019-10-17 NOTE — ED Notes (Signed)
Pt requested to speak to charge per screeners. This RN went out to speak to patient. No answer from lobby.

## 2020-08-13 ENCOUNTER — Emergency Department (HOSPITAL_COMMUNITY)
Admission: EM | Admit: 2020-08-13 | Discharge: 2020-08-13 | Disposition: A | Discharge status: Home / Self Care | Payer: Managed Care, Other (non HMO) | Attending: Emergency Medicine | Admitting: Emergency Medicine

## 2020-08-13 ENCOUNTER — Encounter (HOSPITAL_COMMUNITY): Payer: Self-pay | Admitting: Emergency Medicine

## 2020-08-13 ENCOUNTER — Other Ambulatory Visit: Payer: Self-pay

## 2020-08-13 ENCOUNTER — Emergency Department (HOSPITAL_COMMUNITY): Payer: Managed Care, Other (non HMO)

## 2020-08-13 DIAGNOSIS — J40 Bronchitis, not specified as acute or chronic: Secondary | ICD-10-CM

## 2020-08-13 DIAGNOSIS — J209 Acute bronchitis, unspecified: Secondary | ICD-10-CM | POA: Insufficient documentation

## 2020-08-13 DIAGNOSIS — R042 Hemoptysis: Secondary | ICD-10-CM | POA: Insufficient documentation

## 2020-08-13 DIAGNOSIS — F17200 Nicotine dependence, unspecified, uncomplicated: Secondary | ICD-10-CM | POA: Insufficient documentation

## 2020-08-13 DIAGNOSIS — Z20822 Contact with and (suspected) exposure to covid-19: Secondary | ICD-10-CM | POA: Diagnosis not present

## 2020-08-13 DIAGNOSIS — R059 Cough, unspecified: Secondary | ICD-10-CM | POA: Diagnosis present

## 2020-08-13 LAB — SARS CORONAVIRUS 2 (TAT 6-24 HRS): SARS Coronavirus 2: NEGATIVE

## 2020-08-13 MED ORDER — DOXYCYCLINE HYCLATE 100 MG PO CAPS
100.0000 mg | ORAL_CAPSULE | Freq: Two times a day (BID) | ORAL | 0 refills | Status: DC
Start: 2020-08-13 — End: 2020-08-13

## 2020-08-13 MED ORDER — DOXYCYCLINE HYCLATE 100 MG PO CAPS
100.0000 mg | ORAL_CAPSULE | Freq: Two times a day (BID) | ORAL | 0 refills | Status: AC
Start: 2020-08-13 — End: 2020-08-20

## 2020-08-13 MED ORDER — PROMETHAZINE-DM 6.25-15 MG/5ML PO SYRP
5.0000 mL | ORAL_SOLUTION | Freq: Four times a day (QID) | ORAL | 0 refills | Status: DC | PRN
Start: 2020-08-13 — End: 2020-08-26

## 2020-08-13 MED ORDER — BENZONATATE 100 MG PO CAPS
100.0000 mg | ORAL_CAPSULE | Freq: Three times a day (TID) | ORAL | 0 refills | Status: DC
Start: 2020-08-13 — End: 2020-08-26

## 2020-08-13 MED ORDER — BENZONATATE 100 MG PO CAPS
100.0000 mg | ORAL_CAPSULE | Freq: Three times a day (TID) | ORAL | 0 refills | Status: DC
Start: 2020-08-13 — End: 2020-08-13

## 2020-08-13 MED ORDER — PROMETHAZINE-DM 6.25-15 MG/5ML PO SYRP
5.0000 mL | ORAL_SOLUTION | Freq: Four times a day (QID) | ORAL | 0 refills | Status: DC | PRN
Start: 2020-08-13 — End: 2020-08-13

## 2020-08-13 NOTE — ED Notes (Signed)
C/o coughing up blood onset yest. States it is actually blood streaks c/o chest pain when coughing , denies sob.

## 2020-08-13 NOTE — ED Triage Notes (Signed)
Pt reports coughing up blood since yesterday.  Denies SOB or any other symptoms.

## 2020-08-13 NOTE — ED Provider Notes (Signed)
[facility] EMERGENCY DEPARTMENT Provider Note   CSN: [id number] Arrival date & time: 08/13/20  1009     History Chief Complaint  Patient presents with  . Hemoptysis    Donna West is a 35 y.o. female.  HPI Patient is a 35 year old female presented today with coughing for 2 months.  She states that she smokes marijuana every other day but does not smoke tobacco.  She denies any fevers or chills.  She states she is not had any congestion, nausea vomiting, chest pain or shortness of breath denies any other significant symptoms.  States that her cough has been productive primarily of greenish/yellow mucus.   No recent surgeries, hospitalization, long travel, hemoptysis, estrogen containing OCP, cancer history.  No unilateral leg swelling.  No history of PE or VTE.      History reviewed. No pertinent past medical history.  There are no problems to display for this patient.   History reviewed. No pertinent surgical history.   OB History    Gravida      Para      Term      Preterm      AB      Living  0     SAB      IAB      Ectopic      Multiple      Live Births              No family history on file.  Social History   Tobacco Use  . Smoking status: Light Tobacco Smoker  . Smokeless tobacco: Never Used  . Tobacco comment: Social smoker, 1 per week  Substance Use Topics  . Alcohol use: Yes    Comment: occasionally  . Drug use: No    Home Medications Prior to Admission medications   Medication Sig Start Date End Date Taking? Authorizing Provider  acetaminophen (TYLENOL) 325 MG tablet Take 650 mg by mouth every 6 (six) hours as needed (toothache).    [provider]  benzonatate (TESSALON) 100 MG capsule Take 1 capsule (100 mg total) by mouth every 8 (eight) hours. 08/13/20   Gailen Shelter, PA  doxycycline (VIBRAMYCIN) 100 MG capsule Take 1 capsule (100 mg total) by mouth 2 (two) times daily for 7 days. 08/13/20  08/20/20  Gailen Shelter, PA  hydrOXYzine (ATARAX/VISTARIL) 25 MG tablet Take 1 tablet (25 mg total) by mouth every 6 (six) hours. Patient not taking: Reported on 10/17/2019 06/01/19   Dartha Lodge, PA-C  metoCLOPramide (REGLAN) 10 MG tablet Take 1 tablet (10 mg total) by mouth every 6 (six) hours as needed for nausea (nausea/headache). Patient not taking: Reported on 10/17/2019 03/05/19   Mesner, Barbara Cower, MD  Multiple Vitamins-Minerals (CENTRUM SILVER PO) Take 1 tablet by mouth daily.    [provider]  OVER THE COUNTER MEDICATION Take by mouth daily. Seamoss    [provider]  OVER THE COUNTER MEDICATION Take by mouth daily. Flaxseed oil    [provider]  promethazine-dextromethorphan (PROMETHAZINE-DM) 6.25-15 MG/5ML syrup Take 5 mLs by mouth 4 (four) times daily as needed for cough. 08/13/20   Gailen Shelter, PA    Allergies    Patient has no known allergies.  Review of Systems   Review of Systems  Constitutional: Negative for chills and fever.  HENT: Negative for congestion.   Eyes: Negative for pain.  Respiratory: Positive for cough. Negative for shortness of breath.  Hemoptysis  Cardiovascular: Negative for chest pain and leg swelling.  Gastrointestinal: Negative for abdominal pain, nausea and vomiting.  Genitourinary: Negative for dysuria.  Musculoskeletal: Negative for myalgias.  Skin: Negative for rash.  Neurological: Negative for dizziness and headaches.    Physical Exam Updated Vital Signs BP 109/73 (BP Location: Right Arm)   Pulse 67   Temp 98.6 F (37 C) (Oral)   Resp 16   LMP 07/05/2020   SpO2 100%   Physical Exam Vitals and nursing note reviewed.  Constitutional:      General: She is not in acute distress.    Comments: Pleasant well-appearing 35 year old.  In no acute distress.  Sitting comfortably in bed.  Able answer questions appropriately follow commands. No increased work of breathing. Speaking in full sentences.    HENT:     Head: Normocephalic and atraumatic.     Nose: Nose normal.  Eyes:     General: No scleral icterus. Cardiovascular:     Rate and Rhythm: Normal rate and regular rhythm.     Pulses: Normal pulses.     Heart sounds: Normal heart sounds.  Pulmonary:     Effort: Pulmonary effort is normal. No respiratory distress.     Breath sounds: No wheezing.     Comments: Lungs are clear to auscultation.  No wheezes or crackles auscultated.  No tachypnea, no increased work of breathing, no accessory muscle usage, speaking full sentences. Abdominal:     Palpations: Abdomen is soft.     Tenderness: There is no abdominal tenderness.  Musculoskeletal:     Cervical back: Normal range of motion.     Right lower leg: No edema.     Left lower leg: No edema.  Skin:    General: Skin is warm and dry.     Capillary Refill: Capillary refill takes less than 2 seconds.  Neurological:     Mental Status: She is alert. Mental status is at baseline.  Psychiatric:        Mood and Affect: Mood normal.        Behavior: Behavior normal.     ED Results / Procedures / Treatments   Labs (all labs ordered are listed, but only abnormal results are displayed) Labs Reviewed  SARS CORONAVIRUS 2 (TAT 6-24 HRS)    EKG None  Radiology DG Chest 2 View  Result Date: 08/13/2020 CLINICAL DATA:  Cough and hemoptysis, concern for pneumonia EXAM: CHEST - 2 VIEW COMPARISON:  None. FINDINGS: Artifact from EKG leads. Normal heart size and mediastinal contours. No acute infiltrate or edema. No effusion or pneumothorax. No acute osseous findings. IMPRESSION: Negative chest. Electronically Signed   By: Marnee Spring M.D.   On: 08/13/2020 11:55    Procedures Procedures   Medications Ordered in ED Medications - No data to display  ED Course  I have reviewed the triage vital signs and the nursing notes.  Pertinent labs & imaging results that were available during my care of the patient were reviewed by me and  considered in my medical decision making (see chart for details).    MDM Rules/Calculators/A&P                          Patient with cough ongoing for 1-2 months.  Has had some scant BRB hemoptysis today.  No abdominal pain or vomiting.  She is very well-appearing no tachycardia and she is PERC negative.  I have very low suspicion for pulmonary embolism  in the setting of ongoing coughing will obtain chest x-ray to rule out infection/pneumonia versus tumor.  She is a regular marijuana smoker but does not smoke tobacco.  EKG unremarkable.  Normal sinus rhythm no ST-T wave abnormalities.  Chest x-ray unremarkable there is no infiltrate and no evidence of tumor.  COVID test 6-24-hour obtained.  This will not delay discharge patient is well-appearing will provide patient with antitussives and recommend follow-up with PCP.  Tessalon Perles, doxycycline and Promethazine DM prescribed patient given that she is having hemoptysis and ongoing lengthy course of bronchitis will cover for pneumonia/atypical infection.  I discussed case my attending physician prior to discharge. Patient is agreeable to plan.  Understands return precautions.  Will ultimately need to stop smoking marijuana while she is recovering from this likely viral bronchitis.  Final Clinical Impression(s) / ED Diagnoses Final diagnoses:  Hemoptysis  Bronchitis    Rx / DC Orders ED Discharge Orders         Ordered    doxycycline (VIBRAMYCIN) 100 MG capsule  2 times daily,   Status:  Discontinued        08/13/20 1159    promethazine-dextromethorphan (PROMETHAZINE-DM) 6.25-15 MG/5ML syrup  4 times daily PRN,   Status:  Discontinued        08/13/20 1159    benzonatate (TESSALON) 100 MG capsule  Every 8 hours,   Status:  Discontinued        08/13/20 1159    benzonatate (TESSALON) 100 MG capsule  Every 8 hours        08/13/20 1214    doxycycline (VIBRAMYCIN) 100 MG capsule  2 times daily        08/13/20 1214     promethazine-dextromethorphan (PROMETHAZINE-DM) 6.25-15 MG/5ML syrup  4 times daily PRN        08/13/20 1214           Gailen Shelter, [place] 08/13/20 1539    Tilden Fossa, MD 08/13/20 1655

## 2020-08-13 NOTE — ED Notes (Signed)
DC instructions reviewed with pt. Pt verbalized understanding.  Pt DC 

## 2020-08-13 NOTE — Discharge Instructions (Signed)
Please take antibiotics as prescribed.  Please use the cough medication as I have prescribed you as well.  Please read the information attached on bronchitis.  This often takes a very long time to resolve occasionally 2 months.  Please use Tylenol or ibuprofen for pain.  You may use 600 mg ibuprofen every 6 hours or 1000 mg of Tylenol every 6 hours.  You may choose to alternate between the 2.  This would be most effective.  Not to exceed 4 g of Tylenol within 24 hours.  Not to exceed 3200 mg ibuprofen 24 hours.   Please monitor your symptoms.  You may return to the ER for any new or concerning symptoms.  I have also ordered a COVID test.  He can follow-up with the results on MyChart.

## 2020-08-26 ENCOUNTER — Emergency Department (HOSPITAL_COMMUNITY): Payer: Managed Care, Other (non HMO)

## 2020-08-26 ENCOUNTER — Other Ambulatory Visit: Payer: Self-pay

## 2020-08-26 ENCOUNTER — Emergency Department (HOSPITAL_COMMUNITY)
Admission: EM | Admit: 2020-08-26 | Discharge: 2020-08-26 | Disposition: A | Discharge status: Home / Self Care | Payer: Managed Care, Other (non HMO) | Attending: Emergency Medicine | Admitting: Emergency Medicine

## 2020-08-26 ENCOUNTER — Encounter (HOSPITAL_COMMUNITY): Payer: Self-pay

## 2020-08-26 DIAGNOSIS — F17200 Nicotine dependence, unspecified, uncomplicated: Secondary | ICD-10-CM | POA: Insufficient documentation

## 2020-08-26 DIAGNOSIS — R109 Unspecified abdominal pain: Secondary | ICD-10-CM

## 2020-08-26 DIAGNOSIS — R1031 Right lower quadrant pain: Secondary | ICD-10-CM | POA: Insufficient documentation

## 2020-08-26 DIAGNOSIS — R102 Pelvic and perineal pain: Secondary | ICD-10-CM

## 2020-08-26 DIAGNOSIS — Z760 Encounter for issue of repeat prescription: Secondary | ICD-10-CM | POA: Diagnosis not present

## 2020-08-26 LAB — COMPREHENSIVE METABOLIC PANEL
ALT: 18 U/L (ref 0–44)
AST: 31 U/L (ref 15–41)
Albumin: 3.8 g/dL (ref 3.5–5.0)
Alkaline Phosphatase: 46 U/L (ref 38–126)
Anion gap: 5 (ref 5–15)
BUN: 13 mg/dL (ref 6–20)
CO2: 26 mmol/L (ref 22–32)
Calcium: 9 mg/dL (ref 8.9–10.3)
Chloride: 105 mmol/L (ref 98–111)
Creatinine, Ser: 0.75 mg/dL (ref 0.44–1.00)
GFR, Estimated: >60 mL/min (ref >60)
Glucose, Bld: 71 mg/dL (ref 70–99)
Potassium: 3.6 mmol/L (ref 3.5–5.1)
Sodium: 136 mmol/L (ref 135–145)
Total Bilirubin: 0.3 mg/dL (ref 0.3–1.2)
Total Protein: 6.4 g/dL — ABNORMAL LOW (ref 6.5–8.1)

## 2020-08-26 LAB — CBC WITH DIFFERENTIAL/PLATELET
Abs Immature Granulocytes: 0.01 10*3/uL (ref 0.00–0.07)
Basophils Absolute: 0.1 10*3/uL (ref 0.0–0.1)
Basophils Relative: 1 %
Eosinophils Absolute: 0.1 10*3/uL (ref 0.0–0.5)
Eosinophils Relative: 2 %
HCT: 36 % (ref 36.0–46.0)
Hemoglobin: 12 g/dL (ref 12.0–15.0)
Immature Granulocytes: 0 %
Lymphocytes Relative: 47 %
Lymphs Abs: 3.2 10*3/uL (ref 0.7–4.0)
MCH: 33.3 pg (ref 26.0–34.0)
MCHC: 33.3 g/dL (ref 30.0–36.0)
MCV: 100 fL (ref 80.0–100.0)
Monocytes Absolute: 0.6 10*3/uL (ref 0.1–1.0)
Monocytes Relative: 9 %
Neutro Abs: 2.8 10*3/uL (ref 1.7–7.7)
Neutrophils Relative %: 41 %
Platelets: 149 10*3/uL — ABNORMAL LOW (ref 150–400)
RBC: 3.6 MIL/uL — ABNORMAL LOW (ref 3.87–5.11)
RDW: 11.2 % — ABNORMAL LOW (ref 11.5–15.5)
WBC: 6.8 10*3/uL (ref 4.0–10.5)
nRBC: 0 % (ref 0.0–0.2)

## 2020-08-26 LAB — URINALYSIS, ROUTINE W REFLEX MICROSCOPIC
Bilirubin Urine: NEGATIVE
Glucose, UA: NEGATIVE mg/dL
Hgb urine dipstick: NEGATIVE
Ketones, ur: NEGATIVE mg/dL
Leukocytes,Ua: NEGATIVE
Nitrite: NEGATIVE
Protein, ur: NEGATIVE mg/dL
Specific Gravity, Urine: 1.008 (ref 1.005–1.030)
pH: 6 (ref 5.0–8.0)

## 2020-08-26 LAB — I-STAT BETA HCG BLOOD, ED (MC, WL, AP ONLY): I-stat hCG, quantitative: <5 m[IU]/mL (ref <5)

## 2020-08-26 MED ORDER — PROMETHAZINE-DM 6.25-15 MG/5ML PO SYRP: 5.0000 mL | ORAL_SOLUTION | Freq: Four times a day (QID) | ORAL | 0 refills | Status: DC | PRN

## 2020-08-26 MED ORDER — BENZONATATE 100 MG PO CAPS: 100.0000 mg | ORAL_CAPSULE | Freq: Three times a day (TID) | ORAL | 0 refills | Status: DC

## 2020-08-26 MED ORDER — FAMOTIDINE 20 MG PO TABS: 20.0000 mg | ORAL_TABLET | Freq: Two times a day (BID) | ORAL | 0 refills | Status: DC

## 2020-08-26 MED ORDER — BENZONATATE 100 MG PO CAPS: 100.0000 mg | ORAL_CAPSULE | Freq: Three times a day (TID) | ORAL | 0 refills | Status: AC

## 2020-08-26 MED ORDER — SODIUM CHLORIDE 0.9 % IV BOLUS
1000.0000 mL | Freq: Once | INTRAVENOUS | Status: AC
  Administered 2020-08-26: 1000 mL via INTRAVENOUS

## 2020-08-26 MED ORDER — NAPROXEN 500 MG PO TABS: 500.0000 mg | ORAL_TABLET | Freq: Two times a day (BID) | ORAL | 0 refills | Status: DC

## 2020-08-26 MED ORDER — HYDROMORPHONE HCL 1 MG/ML IJ SOLN: 1.0000 mg | Freq: Once | INTRAMUSCULAR | Status: DC

## 2020-08-26 MED ORDER — PROMETHAZINE-DM 6.25-15 MG/5ML PO SYRP: 5.0000 mL | ORAL_SOLUTION | Freq: Four times a day (QID) | ORAL | 0 refills | Status: AC | PRN

## 2020-08-26 MED ORDER — FAMOTIDINE 20 MG PO TABS: 20.0000 mg | ORAL_TABLET | Freq: Two times a day (BID) | ORAL | 0 refills | Status: AC

## 2020-08-26 MED ORDER — ONDANSETRON 4 MG PO TBDP: 4.0000 mg | ORAL_TABLET | Freq: Once | ORAL | Status: DC

## 2020-08-26 MED ORDER — NAPROXEN 500 MG PO TABS: 500.0000 mg | ORAL_TABLET | Freq: Two times a day (BID) | ORAL | 0 refills | Status: AC

## 2020-08-26 MED ORDER — FENTANYL CITRATE (PF) 100 MCG/2ML IJ SOLN
25.0000 ug | Freq: Once | INTRAMUSCULAR | Status: AC
  Administered 2020-08-26: 25 ug via INTRAVENOUS
  Filled 2020-08-26: qty 2

## 2020-08-26 MED ORDER — IOHEXOL 300 MG/ML  SOLN
80.0000 mL | Freq: Once | INTRAMUSCULAR | Status: AC | PRN
  Administered 2020-08-26: 80 mL via INTRAVENOUS

## 2020-08-26 NOTE — ED Provider Notes (Signed)
MSE was initiated and I personally evaluated the patient and placed orders (if any) at  5:21 AM on Aug 26, 2020.  Patient here with sudden onset abdominal pain that awakened her from sleep.  Denies n/v/d.  Denies dysuria.  Pain rated as 7/10.  Alert and oriented Appears uncomfortable No respiratory distress   Discussed with patient that their care has been initiated.   They are counseled that they will need to remain in the ED until the completion of their workup, including full H&P and results of any tests.  Risks of leaving the emergency department prior to completion of treatment were discussed. Patient was advised to inform ED staff if they are leaving before their treatment is complete. The patient acknowledged these risks and time was allowed for questions.    The patient appears stable so that the remainder of the MSE may be completed by another provider.    Roxy Horseman, PA-C 08/26/20 Harlow Ohms, MD 08/26/20 [phone]

## 2020-08-26 NOTE — Consult Note (Signed)
[facility] Surgery Consult Note  [name] 1985-12-05  [id number].    Requesting MD: Stevie Kern, MD Chief Complaint/Reason for Consult: abdominal pain  HPI:  [name] is a 35 y/o F who presented to the ED early this morning due to abdominal pain that woke her up from her sleep. Pain described as "hot" right lower quadrant pain "like a boot kicking around", non-radiating. Denies similar pain in the past. Did not try any meds at home. Pain temporarily relieved by fentanyl. Currently during my exam the patient reports persistent RLQ pain that is slightly improved compared to her presentation. Denies associated fever, chills, nausea, vomiting, or diarrhea. denies urinary sxs. Denies new/worse vaginal bleeding or discharge. States she has regular periods and had one last month. Denies sick contacts. Denies new sexual partners. Last PO intake was ginger ale around 11:45 AM today in ED.  ROS: As above Review of Systems  All other systems reviewed and are negative.   History reviewed. No pertinent family history.  History reviewed. No pertinent past medical history.  History reviewed. No pertinent surgical history.  Social History:  reports that she has been smoking. She has never used smokeless tobacco. She reports current alcohol use. She reports that she does not use drugs.  Allergies: No Known Allergies  (Not in a hospital admission)   Blood pressure 118/74, pulse 74, temperature 98.2 F (36.8 C), temperature source Oral, resp. rate 18, height 5\' 3"  (1.6 m), weight 49.9 kg, last menstrual period 08/21/2020, SpO2 100 %. Physical Exam: Constitutional: NAD; conversant; no deformities Eyes: Moist conjunctiva; no lid lag; anicteric; PERRL Neck: Trachea midline; no thyromegaly Lungs: Normal respiratory effort; no tactile fremitus CV: RRR; no palpable thrills; no pitting edema GI: Abd soft, non-tender, non-distended, no peritonitis and negative Rovsing's sign. no palpable  hepatosplenomegaly MSK: Normal gait; no clubbing/cyanosis Psychiatric: Appropriate affect; alert and oriented x3 Lymphatic: No palpable cervical or axillary lymphadenopathy  Results for orders placed or performed during the hospital encounter of 08/26/20 (from the past 48 hour(s))  Urinalysis, Routine w reflex microscopic Urine, Clean Catch     Status: Abnormal   Collection Time: 08/26/20  5:22 AM  Result Value Ref Range   Color, Urine STRAW (A) YELLOW   APPearance CLEAR CLEAR   Specific Gravity, Urine 1.008 1.005 - 1.030   pH 6.0 5.0 - 8.0   Glucose, UA NEGATIVE NEGATIVE mg/dL   Hgb urine dipstick NEGATIVE NEGATIVE   Bilirubin Urine NEGATIVE NEGATIVE   Ketones, ur NEGATIVE NEGATIVE mg/dL   Protein, ur NEGATIVE NEGATIVE mg/dL   Nitrite NEGATIVE NEGATIVE   Leukocytes,Ua NEGATIVE NEGATIVE    Comment: Performed at [facility], [address] [place], [place]  CBC with Differential/Platelet     Status: Abnormal   Collection Time: 08/26/20  5:32 AM  Result Value Ref Range   WBC 6.8 4.0 - 10.5 K/uL   RBC 3.60 (L) 3.87 - 5.11 MIL/uL   Hemoglobin 12.0 12.0 - 15.0 g/dL    Comment: REPEATED TO VERIFY   HCT 36.0 36.0 - 46.0 %   MCV 100.0 80.0 - 100.0 fL   MCH 33.3 26.0 - 34.0 pg   MCHC 33.3 30.0 - 36.0 g/dL   RDW 10/26/20 (L) 19.5 - 09.3 %   Platelets 149 (L) 150 - 400 K/uL    Comment: REPEATED TO VERIFY   nRBC 0.0 0.0 - 0.2 %   Neutrophils Relative % 41 %   Neutro Abs 2.8 1.7 - 7.7  K/uL   Lymphocytes Relative 47 %   Lymphs Abs 3.2 0.7 - 4.0 K/uL   Monocytes Relative 9 %   Monocytes Absolute 0.6 0.1 - 1.0 K/uL   Eosinophils Relative 2 %   Eosinophils Absolute 0.1 0.0 - 0.5 K/uL   Basophils Relative 1 %   Basophils Absolute 0.1 0.0 - 0.1 K/uL   Immature Granulocytes 0 %   Abs Immature Granulocytes 0.01 0.00 - 0.07 K/uL    Comment: Performed at [facility], [address] [place], [place] [address]  Comprehensive metabolic panel     Status: Abnormal    Collection Time: 08/26/20  5:32 AM  Result Value Ref Range   Sodium 136 135 - 145 mmol/L   Potassium 3.6 3.5 - 5.1 mmol/L   Chloride 105 98 - 111 mmol/L   CO2 26 22 - 32 mmol/L   Glucose, Bld 71 70 - 99 mg/dL    Comment: Glucose reference range applies only to samples taken after fasting for at least 8 hours.   BUN 13 6 - 20 mg/dL   Creatinine, Ser 3.89 0.44 - 1.00 mg/dL   Calcium 9.0 8.9 - 37.3 mg/dL   Total Protein 6.4 (L) 6.5 - 8.1 g/dL   Albumin 3.8 3.5 - 5.0 g/dL   AST 31 15 - 41 U/L   ALT 18 0 - 44 U/L   Alkaline Phosphatase 46 38 - 126 U/L   Total Bilirubin 0.3 0.3 - 1.2 mg/dL   GFR, Estimated >42 >87 mL/min    Comment: (NOTE) Calculated using the CKD-EPI Creatinine Equation (2021)    Anion gap 5 5 - 15    Comment: Performed at [facility], [address] [place], [place] [address]  I-Stat Beta hCG blood, ED ([facility], [facility], [facility] only)     Status: None   Collection Time: 08/26/20  6:44 AM  Result Value Ref Range   I-stat hCG, quantitative <5.0 <5 mIU/mL   Comment 3            Comment:   GEST. AGE      CONC.  (mIU/mL)   <=1 WEEK        5 - 50     2 WEEKS       50 - 500     3 WEEKS       100 - 10,000     4 WEEKS     1,000 - 30,000        FEMALE AND NON-PREGNANT FEMALE:     LESS THAN 5 mIU/mL    CT ABDOMEN PELVIS W CONTRAST  Result Date: 08/26/2020 CLINICAL DATA:  Right lower quadrant abdominal pain, started this morning EXAM: CT ABDOMEN AND PELVIS WITH CONTRAST TECHNIQUE: Multidetector CT imaging of the abdomen and pelvis was performed using the standard protocol following bolus administration of intravenous contrast. CONTRAST:  38mL OMNIPAQUE IOHEXOL 300 MG/ML  SOLN COMPARISON:  None. FINDINGS: Lower chest: No acute abnormality. Hepatobiliary: No focal liver abnormality is seen. No gallstones, gallbladder wall thickening, or biliary dilatation. Pancreas: Unremarkable. No pancreatic ductal dilatation or surrounding inflammatory changes. Spleen: Normal in size without focal  abnormality. Adrenals/Urinary Tract: Adrenal glands are unremarkable. Kidneys are normal, without renal calculi, focal lesion, or hydronephrosis. The bladder is distended. Stomach/Bowel: Stomach is within normal limits. No evidence of bowel wall thickening, distention, or inflammatory changes. The appendix is nondilated, however there is mild focal adjacent stranding at the appendiceal tip (axial image 61, sagittal image 28).  Significant stool burden within the colon. Vascular/Lymphatic: No significant vascular findings are present. No enlarged abdominal or pelvic lymph nodes. Reproductive: Heterogeneous anterior uterine mass compatible with leiomyoma. Bilateral adnexa are unremarkable. Other: No abdominal wall hernia or abnormality. No abdominopelvic ascites. No abdominopelvic fluid collection. Musculoskeletal: No acute or significant osseous findings. IMPRESSION: The appendix is nondilated, though with possible mild adjacent inflammatory stranding near the appendiceal tip. If there are other suggestive clinical signs, it is possible this represents early tip appendicitis. Correlate with exam and laboratory findings. Large uterine fibroid.  Distended urinary bladder. No other acute abnormality in the abdomen or pelvis. These results were called by telephone at the time of interpretation on 08/26/2020 at 11:39 am to provider [facility] PA, who verbally acknowledged these results. Electronically Signed   By: Caprice Renshaw   On: 08/26/2020 11:17    Assessment/Plan Acute onset lower abdominal pain   35 y/o F with acute onset of lower abdominal pain that started roughly 12 hours ago, now slightly improved. No associated fever, chills, nausea, vomiting, or diarrhea. Afebrile with normal labs. Benign abdominal exam - non-tender in RLQ. CT scan with normal caliber appendix with a question of stranding. Clinically she currently does not have appendicitis. From a general surgery perspective I think it is safe for her to  discharge home with strict return precautions if she develops sxs consistent with appendicitis -- worsening, RLQ pain, fever, nausea/vomiting, and diarrhea. Would await the results of her transvaginal ultrasound prior to discharge. No reported GYN sxs. Fibroids on her CT without evidence of bleeding.    Adam Phenix, PA-C [facility] Please see Amion for pager number during day hours 7:00am-4:30pm 08/26/2020, 1:40 PM

## 2020-08-26 NOTE — Discharge Instructions (Signed)
The general surgery team does not believe her symptoms are caused by acute appendicitis.  Your ultrasound did not show any evidence of ovarian cyst or torsion. Take medications as needed to help with your symptoms. If your pain acutely worsens that is not controlled by these medications, you have uncontrollable vomiting, fever you will need to return to the ER.

## 2020-08-26 NOTE — ED Notes (Signed)
Patient was given a Ginger Ale to drink.

## 2020-08-26 NOTE — ED Triage Notes (Signed)
Abdominal pain that woke pt up from sleep. Denies any vomiting and diarrhea.

## 2020-08-26 NOTE — ED Provider Notes (Signed)
[facility] EMERGENCY DEPARTMENT Provider Note   CSN: [id number] Arrival date & time: 08/26/20  0515     History Chief Complaint  Patient presents with  . Abdominal Pain    Donna West is a 35 y.o. female who presents to ED with a chief complaint of right lower quadrant abdominal pain.  States that around 4 hours ago she started experiencing acute onset of right lower quadrant pain that has been intermittent.  This woke her up from her sleep.  No history of similar symptoms in the past.  Has not tried medications to help with her symptoms.  Reports 1 episode of vomiting a few days ago but none since this pain began.  No changes to bowel movements or urination.  No vaginal discharge, abnormal vaginal bleeding or pelvic concerns.  No prior abdominal surgeries.  Reports chronic cough.  No chest pain.   HPI     History reviewed. No pertinent past medical history.  There are no problems to display for this patient.   History reviewed. No pertinent surgical history.   OB History    Gravida      Para      Term      Preterm      AB      Living  0     SAB      IAB      Ectopic      Multiple      Live Births              History reviewed. No pertinent family history.  Social History   Tobacco Use  . Smoking status: Light Tobacco Smoker  . Smokeless tobacco: Never Used  . Tobacco comment: Social smoker, 1 per week  Substance Use Topics  . Alcohol use: Yes    Comment: occasionally  . Drug use: No    Home Medications Prior to Admission medications   Medication Sig Start Date End Date Taking? Authorizing Provider  benzonatate (TESSALON) 100 MG capsule Take 1 capsule (100 mg total) by mouth every 8 (eight) hours. 08/26/20  Yes Larra Crunkleton, PA-C  famotidine (PEPCID) 20 MG tablet Take 1 tablet (20 mg total) by mouth 2 (two) times daily. 08/26/20  Yes Linkoln Alkire, PA-C  naproxen (NAPROSYN) 500 MG tablet Take 1 tablet (500 mg total) by mouth 2  (two) times daily. 08/26/20  Yes Lawrnce Reyez, PA-C  acetaminophen (TYLENOL) 325 MG tablet Take 650 mg by mouth every 6 (six) hours as needed (toothache).    [provider]  hydrOXYzine (ATARAX/VISTARIL) 25 MG tablet Take 1 tablet (25 mg total) by mouth every 6 (six) hours. Patient not taking: Reported on 10/17/2019 06/01/19   Dartha Lodge, PA-C  metoCLOPramide (REGLAN) 10 MG tablet Take 1 tablet (10 mg total) by mouth every 6 (six) hours as needed for nausea (nausea/headache). Patient not taking: Reported on 10/17/2019 03/05/19   Mesner, Barbara Cower, MD  Multiple Vitamins-Minerals (CENTRUM SILVER PO) Take 1 tablet by mouth daily.    [provider]  OVER THE COUNTER MEDICATION Take by mouth daily. Seamoss    [provider]  OVER THE COUNTER MEDICATION Take by mouth daily. Flaxseed oil    [provider]  promethazine-dextromethorphan (PROMETHAZINE-DM) 6.25-15 MG/5ML syrup Take 5 mLs by mouth 4 (four) times daily as needed for cough. 08/13/20   Gailen Shelter, PA    Allergies    Patient has no known allergies.  Review of Systems  Review of Systems  Constitutional: Negative for appetite change, chills and fever.  HENT: Negative for ear pain, rhinorrhea, sneezing and sore throat.   Eyes: Negative for photophobia and visual disturbance.  Respiratory: Negative for cough, chest tightness, shortness of breath and wheezing.   Cardiovascular: Negative for chest pain and palpitations.  Gastrointestinal: Positive for abdominal pain. Negative for blood in stool, constipation, diarrhea, nausea and vomiting.  Genitourinary: Negative for dysuria, hematuria, urgency, vaginal bleeding and vaginal discharge.  Musculoskeletal: Negative for myalgias.  Skin: Negative for rash.  Neurological: Negative for dizziness, weakness and light-headedness.    Physical Exam Updated Vital Signs BP 118/74 (BP Location: Left Arm)   Pulse 74   Temp 98.2 F (36.8 C) (Oral)   Resp 18   Ht  5\' 3"  (1.6 m)   Wt 49.9 kg   LMP 08/21/2020   SpO2 100%   BMI 19.49 kg/m   Physical Exam Vitals and nursing note reviewed.  Constitutional:      General: She is not in acute distress.    Appearance: She is well-developed.  HENT:     Head: Normocephalic and atraumatic.     Nose: Nose normal.  Eyes:     General: No scleral icterus.       Left eye: No discharge.     Conjunctiva/sclera: Conjunctivae normal.  Cardiovascular:     Rate and Rhythm: Normal rate and regular rhythm.     Heart sounds: Normal heart sounds. No murmur heard. No friction rub. No gallop.   Pulmonary:     Effort: Pulmonary effort is normal. No respiratory distress.     Breath sounds: Normal breath sounds.  Abdominal:     General: Bowel sounds are normal. There is no distension.     Palpations: Abdomen is soft.     Tenderness: There is abdominal tenderness in the right lower quadrant. There is no guarding.  Musculoskeletal:        General: Normal range of motion.     Cervical back: Normal range of motion and neck supple.  Skin:    General: Skin is warm and dry.     Findings: No rash.  Neurological:     Mental Status: She is alert.     Motor: No abnormal muscle tone.     Coordination: Coordination normal.     ED Results / Procedures / Treatments   Labs (all labs ordered are listed, but only abnormal results are displayed) Labs Reviewed  CBC WITH DIFFERENTIAL/PLATELET - Abnormal; Notable for the following components:      Result Value   RBC 3.60 (*)    RDW 11.2 (*)    Platelets 149 (*)    All other components within normal limits  COMPREHENSIVE METABOLIC PANEL - Abnormal; Notable for the following components:   Total Protein 6.4 (*)    All other components within normal limits  URINALYSIS, ROUTINE W REFLEX MICROSCOPIC - Abnormal; Notable for the following components:   Color, Urine STRAW (*)    All other components within normal limits  I-STAT BETA HCG BLOOD, ED ([facility], [facility], [facility] ONLY)     EKG None  Radiology CT ABDOMEN PELVIS W CONTRAST  Result Date: 08/26/2020 CLINICAL DATA:  Right lower quadrant abdominal pain, started this morning EXAM: CT ABDOMEN AND PELVIS WITH CONTRAST TECHNIQUE: Multidetector CT imaging of the abdomen and pelvis was performed using the standard protocol following bolus administration of intravenous contrast. CONTRAST:  42mL OMNIPAQUE IOHEXOL 300 MG/ML  SOLN COMPARISON:  None. FINDINGS:  Lower chest: No acute abnormality. Hepatobiliary: No focal liver abnormality is seen. No gallstones, gallbladder wall thickening, or biliary dilatation. Pancreas: Unremarkable. No pancreatic ductal dilatation or surrounding inflammatory changes. Spleen: Normal in size without focal abnormality. Adrenals/Urinary Tract: Adrenal glands are unremarkable. Kidneys are normal, without renal calculi, focal lesion, or hydronephrosis. The bladder is distended. Stomach/Bowel: Stomach is within normal limits. No evidence of bowel wall thickening, distention, or inflammatory changes. The appendix is nondilated, however there is mild focal adjacent stranding at the appendiceal tip (axial image 61, sagittal image 28). Significant stool burden within the colon. Vascular/Lymphatic: No significant vascular findings are present. No enlarged abdominal or pelvic lymph nodes. Reproductive: Heterogeneous anterior uterine mass compatible with leiomyoma. Bilateral adnexa are unremarkable. Other: No abdominal wall hernia or abnormality. No abdominopelvic ascites. No abdominopelvic fluid collection. Musculoskeletal: No acute or significant osseous findings. IMPRESSION: The appendix is nondilated, though with possible mild adjacent inflammatory stranding near the appendiceal tip. If there are other suggestive clinical signs, it is possible this represents early tip appendicitis. Correlate with exam and laboratory findings. Large uterine fibroid.  Distended urinary bladder. No other acute abnormality in the  abdomen or pelvis. These results were called by telephone at the time of interpretation on 08/26/2020 at 11:39 am to provider [facility] PA, who verbally acknowledged these results. Electronically Signed   By: Caprice Renshaw   On: 08/26/2020 11:17   US PELVIC COMPLETE W TRANSVAGINAL AND TORSION R/O  Result Date: 08/26/2020 CLINICAL DATA:  Right-sided pelvic pain. EXAM: TRANSABDOMINAL AND TRANSVAGINAL ULTRASOUND OF PELVIS TECHNIQUE: Both transabdominal and transvaginal ultrasound examinations of the pelvis were performed. Transabdominal technique was performed for global imaging of the pelvis including uterus, ovaries, adnexal regions, and pelvic cul-de-sac. It was necessary to proceed with endovaginal exam following the transabdominal exam to visualize the uterus. COMPARISON:  CT of the abdomen and pelvis with contrast 08/26/2020 FINDINGS: Uterus Measurements: 11.3 x 6.6 x 6.3 cm = volume: 248 mL. A large anterior fibroid measures 7 x 7 x 7 cm. This abuts the endometrium. Endometrium Thickness: 10 mm.  No focal abnormality visualized. Right ovary Measurements: 4.6 x 2.5 x 2.4 cm = volume: 14.9 mL. Normal appearance/no adnexal mass. Left ovary Measurements: 3.2 x 2.2 x 1.8 cm = volume: 6.8 mL. Normal appearance/no adnexal mass. Other findings Trace free fluid is present.  Likely physiologic IMPRESSION: 1. Large anterior fibroid. Electronically Signed   By: Marin Roberts M.D.   On: 08/26/2020 14:07    Procedures Procedures   Medications Ordered in ED Medications  sodium chloride 0.9 % bolus 1,000 mL (0 mLs Intravenous Stopped 08/26/20 1306)  fentaNYL (SUBLIMAZE) injection 25 mcg (25 mcg Intravenous Given 08/26/20 0726)  iohexol (OMNIPAQUE) 300 MG/ML solution 80 mL (80 mLs Intravenous Contrast Given 08/26/20 1037)  fentaNYL (SUBLIMAZE) injection 25 mcg (25 mcg Intravenous Given 08/26/20 1242)    ED Course  I have reviewed the triage vital signs and the nursing notes.  Pertinent labs & imaging results  that were available during my care of the patient were reviewed by me and considered in my medical decision making (see chart for details).  Clinical Course as of 08/26/20 1432  Fri Aug 26, 2020  1147 Spoke to radiologist who was reading patient CT scan.  He is concerned for possible early tip appendicitis although the appendix is not diffusely dilated.  There is some stranding near the tip as well as gas proximally.  I will consult general surgery for their recommendations as patient  does have right lower quadrant tenderness despite no leukocytosis or fever. [HK]  1213 Spoke to Dr. Dwain SarnaWakefield, general surgeon on-call.  He will see the patient at the bedside and review imaging but in the meantime asked that we obtain a pelvic ultrasound to rule out torsion. [HK]  1410 Pelvic u/s with evidence of large fibroid but no evidence of ovarian cyst or torsion. Awaiting general surgery recs. [HK]    Clinical Course User Index [HK] Dietrich PatesKhatri, Kasiah Manka, PA-C   MDM Rules/Calculators/A&P                          35 year old female presenting to the ED with a chief complaint of acute onset right lower quadrant abdominal pain.  Reports vomiting several days ago but denies any recently.  No changes to bowel movements or urination.  No pelvic or vaginal complaints.  Has not tried medications to help with her symptoms.  On exam there is tenderness palpation of the right lower quadrant without rebound or guarding.  Vital signs are within normal limits.  Lab work obtained in triage including CBC, CMP unremarkable.  Urinalysis unremarkable.  hCG is negative.  Will obtain CT of abdomen pelvis and reassess after medication.  CT with possible tip appendicitis without generalized dilatation.  I discussion with the radiologist regarding his reading.  Pelvic ultrasound obtained at the request of general surgery for possible other cause of right lower quadrant pain.  This was negative for torsion or other abnormality that could explain  the pain.  Surgery consulted at the bedside who recommends discharge home as they do not believe her symptoms are consistent with acute appendicitis.  She remains in no acute distress.  Will treat symptomatically at home but will tell her to return for any severe worsening symptoms. Patient agreeable to plan. Return precautions given.  Appreciate the help of surgery for management of this patient. She is also requesting a cough medicine as she was prescribed 1 a few weeks ago but was never able to pick it up from the pharmacy.  Patient is hemodynamically stable, in NAD, and able to ambulate in the ED. Evaluation does not show pathology that would require ongoing emergent intervention or inpatient treatment. I explained the diagnosis to the patient. Pain has been managed and has no complaints prior to discharge. Patient is comfortable with above plan and is stable for discharge at this time. All questions were answered prior to disposition. Strict return precautions for returning to the ED were discussed. Encouraged follow up with PCP.   An After Visit Summary was printed and given to the patient.   Portions of this note were generated with Scientist, clinical (histocompatibility and immunogenetics)Dragon dictation software. Dictation errors may occur despite best attempts at proofreading.   Final Clinical Impression(s) / ED Diagnoses Final diagnoses:  Abdominal wall pain  Encounter for medication refill    Rx / DC Orders ED Discharge Orders         Ordered    naproxen (NAPROSYN) 500 MG tablet  2 times daily        08/26/20 1428    famotidine (PEPCID) 20 MG tablet  2 times daily        08/26/20 1428    benzonatate (TESSALON) 100 MG capsule  Every 8 hours        08/26/20 1431           Dietrich PatesKhatri, Maiyah Goyne, PA-C 08/26/20 1432    Milagros Lollykstra, Richard S, MD 08/26/20 2049

## 2020-11-26 ENCOUNTER — Other Ambulatory Visit: Payer: Self-pay

## 2020-11-26 ENCOUNTER — Encounter (HOSPITAL_COMMUNITY): Payer: Self-pay | Admitting: Emergency Medicine

## 2020-11-26 ENCOUNTER — Emergency Department (HOSPITAL_COMMUNITY)
Admission: EM | Admit: 2020-11-26 | Discharge: 2020-11-26 | Disposition: A | Discharge status: Home / Self Care | Payer: Self-pay | Attending: Emergency Medicine | Admitting: Emergency Medicine

## 2020-11-26 ENCOUNTER — Emergency Department (HOSPITAL_COMMUNITY): Payer: Self-pay

## 2020-11-26 DIAGNOSIS — Z20822 Contact with and (suspected) exposure to covid-19: Secondary | ICD-10-CM | POA: Insufficient documentation

## 2020-11-26 DIAGNOSIS — R0602 Shortness of breath: Secondary | ICD-10-CM | POA: Insufficient documentation

## 2020-11-26 DIAGNOSIS — F17200 Nicotine dependence, unspecified, uncomplicated: Secondary | ICD-10-CM | POA: Insufficient documentation

## 2020-11-26 DIAGNOSIS — R6883 Chills (without fever): Secondary | ICD-10-CM | POA: Insufficient documentation

## 2020-11-26 DIAGNOSIS — N9489 Other specified conditions associated with female genital organs and menstrual cycle: Secondary | ICD-10-CM | POA: Insufficient documentation

## 2020-11-26 DIAGNOSIS — R0789 Other chest pain: Secondary | ICD-10-CM | POA: Insufficient documentation

## 2020-11-26 LAB — BASIC METABOLIC PANEL
Anion gap: 9 (ref 5–15)
BUN: 7 mg/dL (ref 6–20)
CO2: 23 mmol/L (ref 22–32)
Calcium: 9.2 mg/dL (ref 8.9–10.3)
Chloride: 104 mmol/L (ref 98–111)
Creatinine, Ser: 0.68 mg/dL (ref 0.44–1.00)
GFR, Estimated: >60 mL/min (ref >60)
Glucose, Bld: 106 mg/dL — ABNORMAL HIGH (ref 70–99)
Potassium: 3.6 mmol/L (ref 3.5–5.1)
Sodium: 136 mmol/L (ref 135–145)

## 2020-11-26 LAB — TROPONIN I (HIGH SENSITIVITY)
Troponin I (High Sensitivity): 3 ng/L (ref <18)
Troponin I (High Sensitivity): <2 ng/L (ref <18)

## 2020-11-26 LAB — CBC
HCT: 36.2 % (ref 36.0–46.0)
Hemoglobin: 12.2 g/dL (ref 12.0–15.0)
MCH: 33.2 pg (ref 26.0–34.0)
MCHC: 33.7 g/dL (ref 30.0–36.0)
MCV: 98.4 fL (ref 80.0–100.0)
Platelets: 154 10*3/uL (ref 150–400)
RBC: 3.68 MIL/uL — ABNORMAL LOW (ref 3.87–5.11)
RDW: 11.4 % — ABNORMAL LOW (ref 11.5–15.5)
WBC: 6.5 10*3/uL (ref 4.0–10.5)
nRBC: 0 % (ref 0.0–0.2)

## 2020-11-26 LAB — I-STAT BETA HCG BLOOD, ED (MC, WL, AP ONLY): I-stat hCG, quantitative: <5 m[IU]/mL (ref <5)

## 2020-11-26 LAB — SARS CORONAVIRUS 2 (TAT 6-24 HRS): SARS Coronavirus 2: NEGATIVE

## 2020-11-26 MED ORDER — ALUM & MAG HYDROXIDE-SIMETH 200-200-20 MG/5ML PO SUSP
30.0000 mL | Freq: Once | ORAL | Status: AC
  Administered 2020-11-26: 30 mL via ORAL
  Filled 2020-11-26: qty 30

## 2020-11-26 MED ORDER — LIDOCAINE VISCOUS HCL 2 % MT SOLN
15.0000 mL | Freq: Once | OROMUCOSAL | Status: AC
  Administered 2020-11-26: 15 mL via ORAL
  Filled 2020-11-26: qty 15

## 2020-11-26 MED ORDER — KETOROLAC TROMETHAMINE 30 MG/ML IJ SOLN
30.0000 mg | Freq: Once | INTRAMUSCULAR | Status: AC
  Administered 2020-11-26: 30 mg via INTRAVENOUS
  Filled 2020-11-26: qty 1

## 2020-11-26 NOTE — ED Notes (Signed)
Patient transported to X-ray Xray will bring to room when done 

## 2020-11-26 NOTE — ED Provider Notes (Signed)
[facility] EMERGENCY DEPARTMENT Provider Note   CSN: [id number] Arrival date & time: 11/26/20  6301     History Chief Complaint  Patient presents with   Chest Pain    Donna West is a 35 y.o. female.  The history is provided by the patient. No language interpreter was used.  Chest Pain  35 year old female who presents complaining of chest pain.  Patient reports she woke up this morning experiencing pain to the left side of her chest.  She described pain as a pressure sensation with associated shortness of breath, moderate in severity, persistent, nothing seems to make it better or worse.  She endorses mild chills.  She denies having fever, productive cough, sore throat, nausea, vomiting, diarrhea, back pain arm pain or jaw pain.  She denies any significant family history of cardiac disease.  She has never had this pain before.  She ate [employer] last night.  No prior history of PE or DVT no recent surgery prolonged bedrest active cancer or taking oral hormone.  No history of heartburn.  Denies any recent strenuous activities or heavy lifting.  History reviewed. No pertinent past medical history.  There are no problems to display for this patient.   History reviewed. No pertinent surgical history.   OB History     Gravida      Para      Term      Preterm      AB      Living  0      SAB      IAB      Ectopic      Multiple      Live Births              No family history on file.  Social History   Tobacco Use   Smoking status: Light Smoker   Smokeless tobacco: Never   Tobacco comments:    Social smoker, 1 per week  Substance Use Topics   Alcohol use: Yes    Comment: occasionally   Drug use: No    Home Medications Prior to Admission medications   Medication Sig Start Date End Date Taking? Authorizing Provider  acetaminophen (TYLENOL) 325 MG tablet Take 650 mg by mouth every 6 (six) hours as needed (toothache).    [provider]  benzonatate (TESSALON) 100 MG capsule Take 1 capsule (100 mg total) by mouth every 8 (eight) hours. 08/26/20   Khatri, Hina, PA-C  famotidine (PEPCID) 20 MG tablet Take 1 tablet (20 mg total) by mouth 2 (two) times daily. 08/26/20   Khatri, Hina, PA-C  hydrOXYzine (ATARAX/VISTARIL) 25 MG tablet Take 1 tablet (25 mg total) by mouth every 6 (six) hours. Patient not taking: Reported on 10/17/2019 06/01/19   Dartha Lodge, PA-C  metoCLOPramide (REGLAN) 10 MG tablet Take 1 tablet (10 mg total) by mouth every 6 (six) hours as needed for nausea (nausea/headache). Patient not taking: Reported on 10/17/2019 03/05/19   Mesner, Barbara Cower, MD  Multiple Vitamins-Minerals (CENTRUM SILVER PO) Take 1 tablet by mouth daily.    [provider]  naproxen (NAPROSYN) 500 MG tablet Take 1 tablet (500 mg total) by mouth 2 (two) times daily. 08/26/20   Khatri, Hina, PA-C  OVER THE COUNTER MEDICATION Take by mouth daily. Seamoss    [provider]  OVER THE COUNTER MEDICATION Take by mouth daily. Flaxseed oil    [provider]  promethazine-dextromethorphan (PROMETHAZINE-DM) 6.25-15 MG/5ML syrup Take 5 mLs  by mouth 4 (four) times daily as needed for cough. 08/26/20   Dietrich Pates, PA-C    Allergies    Patient has no known allergies.  Review of Systems   Review of Systems  Cardiovascular:  Positive for chest pain.  All other systems reviewed and are negative.  Physical Exam Updated Vital Signs BP (!) 148/77 (BP Location: Right Arm)   Pulse 62   Temp (!) 97.5 F (36.4 C) (Oral)   Resp 15   LMP 11/02/2020 (Approximate)   SpO2 100%   Physical Exam Vitals and nursing note reviewed.  Constitutional:      General: She is not in acute distress.    Appearance: She is well-developed.  HENT:     Head: Atraumatic.  Eyes:     Conjunctiva/sclera: Conjunctivae normal.  Cardiovascular:     Rate and Rhythm: Normal rate and regular rhythm.     Pulses: Normal pulses.     Heart sounds:  Normal heart sounds.  Pulmonary:     Effort: Pulmonary effort is normal.     Breath sounds: No wheezing, rhonchi or rales.  Abdominal:     Palpations: Abdomen is soft.     Tenderness: There is no abdominal tenderness.  Musculoskeletal:     Cervical back: Neck supple.     Right lower leg: No edema.     Left lower leg: No edema.  Skin:    Capillary Refill: Capillary refill takes less than 2 seconds.     Findings: No rash.  Neurological:     Mental Status: She is alert and oriented to person, place, and time.  Psychiatric:        Mood and Affect: Mood normal.    ED Results / Procedures / Treatments   Labs (all labs ordered are listed, but only abnormal results are displayed) Labs Reviewed  CBC - Abnormal; Notable for the following components:      Result Value   RBC 3.68 (*)    RDW 11.4 (*)    All other components within normal limits  BASIC METABOLIC PANEL  I-STAT BETA HCG BLOOD, ED ([facility], [facility], [facility] ONLY)  TROPONIN I (HIGH SENSITIVITY)    EKG None ED ECG REPORT   Date: 11/26/2020  Rate: 81  Rhythm: normal sinus rhythm and sinus arrhythmia  QRS Axis: normal  Intervals: PR shortened  ST/T Wave abnormalities: nonspecific ST changes  Conduction Disutrbances:none  Narrative Interpretation:   Old EKG Reviewed: unchanged  I have personally reviewed the EKG tracing and agree with the computerized printout as noted.   Radiology DG Chest 2 View  Result Date: 11/26/2020 CLINICAL DATA:  35 year old female with history of chest pain and shortness of breath. EXAM: CHEST - 2 VIEW COMPARISON:  Chest x-ray 08/13/2020. FINDINGS: Lung volumes are normal. No consolidative airspace disease. No pleural effusions. No pneumothorax. No pulmonary nodule or mass noted. Pulmonary vasculature and the cardiomediastinal silhouette are within normal limits. IMPRESSION: No radiographic evidence of acute cardiopulmonary disease. Electronically Signed   By: Trudie Reed M.D.   On: 11/26/2020 07:58     Procedures Procedures   Medications Ordered in ED Medications  alum & mag hydroxide-simeth (MAALOX/MYLANTA) 200-200-20 MG/5ML suspension 30 mL (30 mLs Oral Given 11/26/20 0832)    And  lidocaine (XYLOCAINE) 2 % viscous mouth solution 15 mL (15 mLs Oral Given 11/26/20 0832)  ketorolac (TORADOL) 30 MG/ML injection 30 mg (30 mg Intravenous Given 11/26/20 0932)    ED Course  I have reviewed the triage  vital signs and the nursing notes.  Pertinent labs & imaging results that were available during my care of the patient were reviewed by me and considered in my medical decision making (see chart for details).    MDM Rules/Calculators/A&P                           BP 116/68   Pulse (!) 51   Temp 98.3 F (36.8 C) (Oral)   Resp 14   LMP 11/02/2020 (Approximate)   SpO2 94%   Final Clinical Impression(s) / ED Diagnoses Final diagnoses:  Atypical chest pain    Rx / DC Orders ED Discharge Orders     None       8:27 AM The patient's chest pain is not suggestive of pulmonary embolus, cardiac ischemia, aortic dissection, pericarditis, myocarditis, pulmonary embolism, pneumothorax, pneumonia, Zoster, or esophageal perforation, or other serious etiology.  Historically not abrupt in onset, tearing or ripping, pulses symmetric. EKG nonspecific for ischemia/infarction. No dysrhythmias, brugada, WPW, prolonged QT noted. [CXR reviewed and WNL] [Troponin negative x2. CXR reviewed. Labs without demonstration of acute pathology unless otherwise noted above. Low HEART Score: 0-3 points (0.9-1.7% risk of MACE).] Given the extremely low risk of these diagnoses further testing and evaluation for these possibilities does not appear to be indicated at this time. Patient in no distress and overall condition improved here in the ED. Detailed discussions were had with the patient regarding current findings, and need for close f/u with PCP or on call doctor. The patient has been instructed to return immediately  if the symptoms worsen in any way for re-evaluation. Patient verbalized understanding and is in agreement with current care plan. All questions answered prior to discharge.    Fayrene Helper, PA-C 11/26/20 1218    Virgina Norfolk, DO 11/26/20 1451

## 2020-11-26 NOTE — ED Triage Notes (Signed)
Reports pain to center of chest and SOB for a few hours that is worse when lying down. Denies cough.  Denies nausea and vomiting.

## 2020-11-26 NOTE — ED Notes (Addendum)
Pt in R at this time. XR tech to bring pt to room after CXR.

## 2020-11-26 NOTE — Discharge Instructions (Addendum)
Please continue taking Tylenol or ibuprofen as needed for your discomfort.  Call or follow-up closely with your primary care doctor for further management of your condition.  Return to the ER if your symptoms worsen or if you have any concern.  You can check your COVID test through MyChart, link below.

## 2022-05-05 IMAGING — CR DG CHEST 2V
2 series · 2 of 2 positions shown · non-contrast
Comparison: None.

CLINICAL DATA: Cough and hemoptysis, concern for pneumonia

EXAM:
CHEST - 2 VIEW

[chest pa]
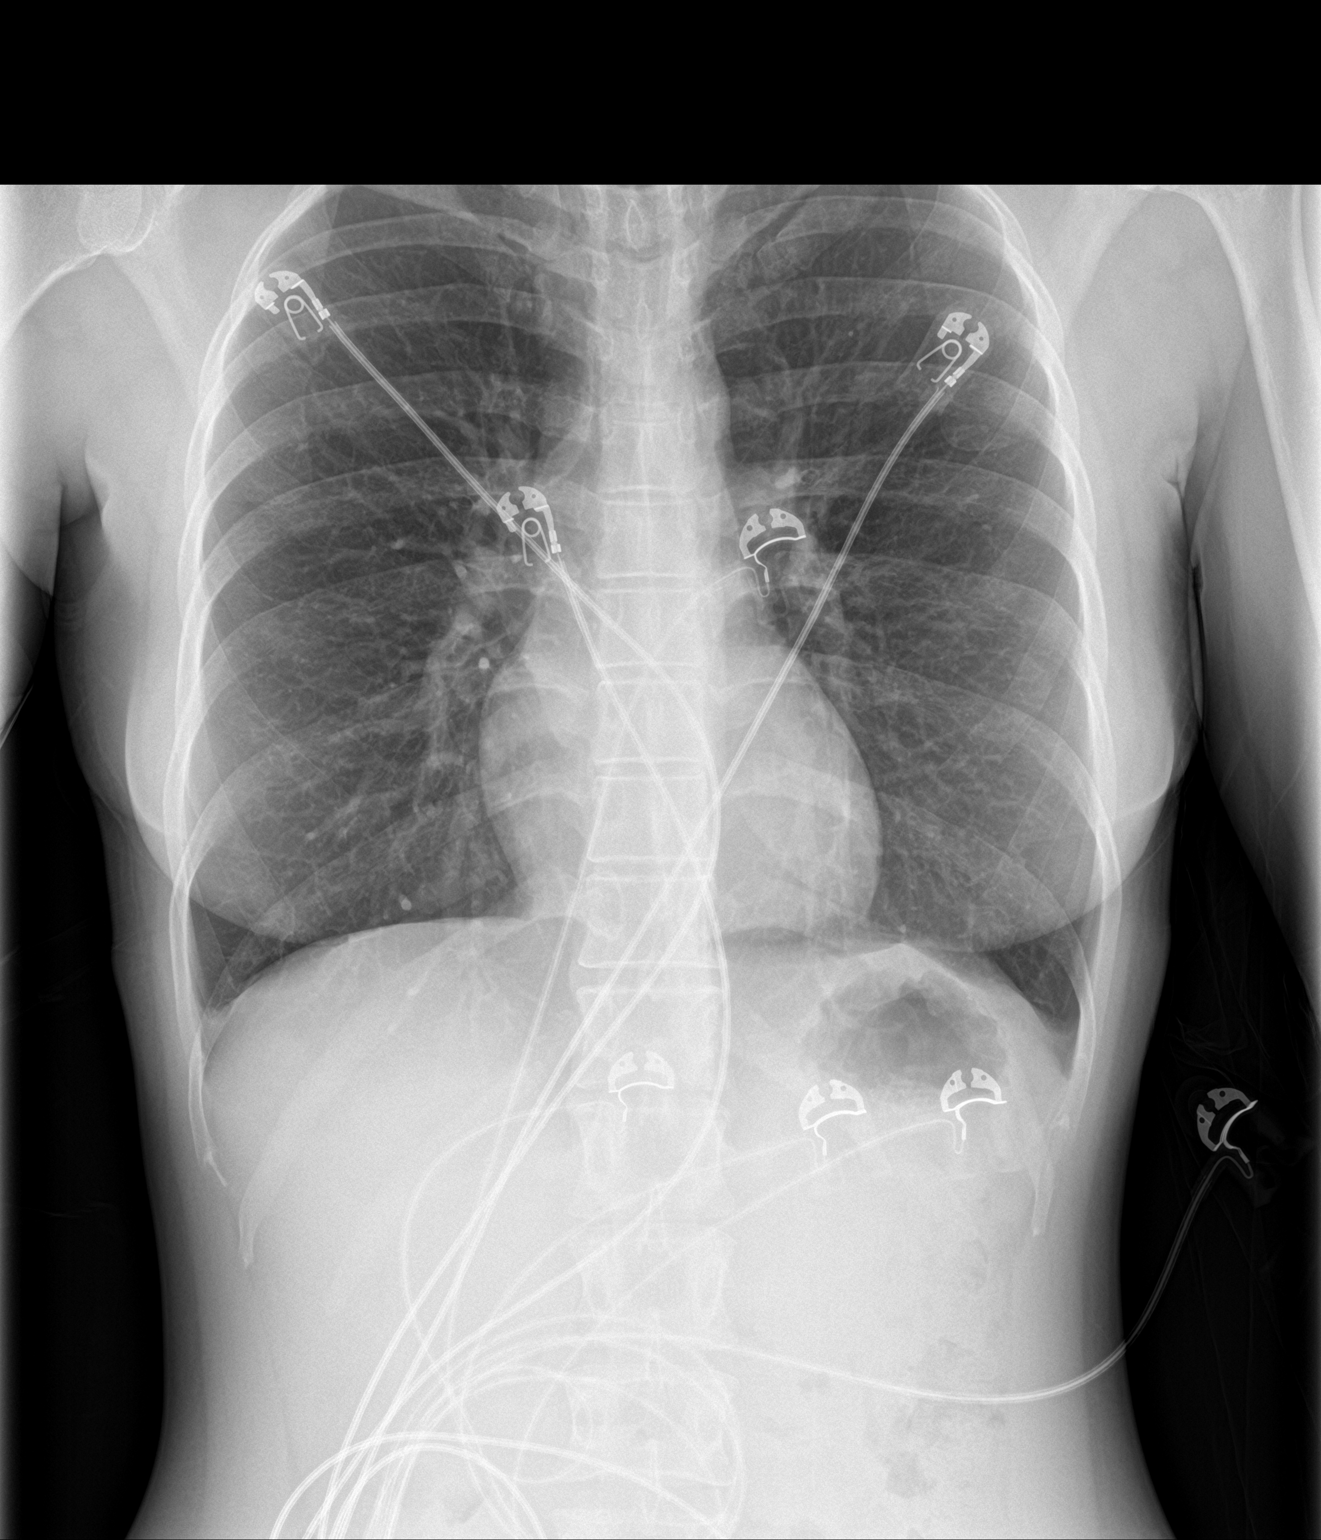

[chest lat]
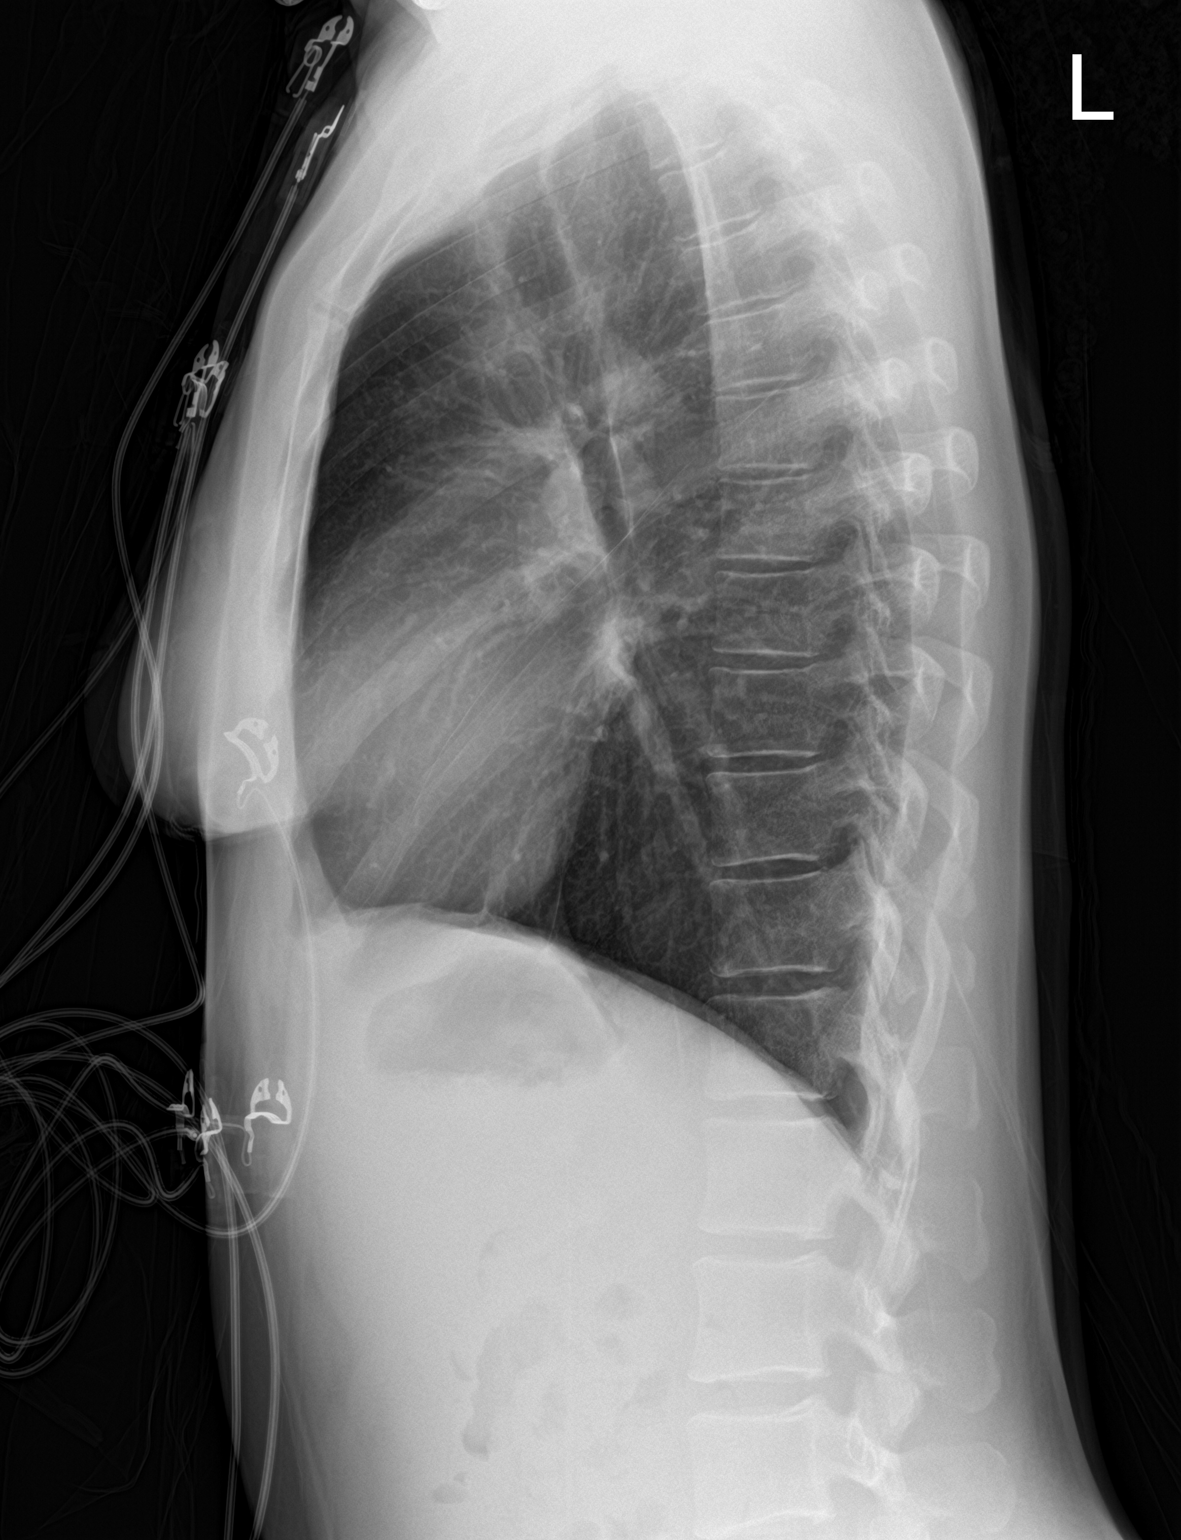

[2 of 2 positions shown; findings below may reference images not displayed]

FINDINGS: Artifact from EKG leads.

Normal heart size and mediastinal contours. No acute infiltrate or
edema. No effusion or pneumothorax. No acute osseous findings.
IMPRESSION: Negative chest.
